# Patient Record
Sex: Female | Born: 1975 | Race: Black or African American | Hispanic: No | Smoking: Former smoker
Health system: Southern US, Community
[De-identification: ages and names within clinical notes are randomized; demographics above are authoritative.]

## PROBLEM LIST (undated history)

## (undated) DIAGNOSIS — D649 Anemia, unspecified: Secondary | ICD-10-CM

## (undated) HISTORY — PX: TUBAL LIGATION: SHX77

## (undated) HISTORY — PX: WISDOM TOOTH EXTRACTION: SHX21

---

## 1998-11-27 ENCOUNTER — Other Ambulatory Visit: Admission: RE | Admit: 1998-11-27 | Discharge: 1998-11-27 | Payer: Self-pay | Admitting: Obstetrics and Gynecology

## 1999-02-26 ENCOUNTER — Inpatient Hospital Stay (HOSPITAL_COMMUNITY): Admission: AD | Admit: 1999-02-26 | Discharge: 1999-02-26 | Payer: Self-pay | Admitting: Obstetrics & Gynecology

## 1999-04-19 ENCOUNTER — Inpatient Hospital Stay (HOSPITAL_COMMUNITY): Admission: AD | Admit: 1999-04-19 | Discharge: 1999-04-19 | Payer: Self-pay | Admitting: *Deleted

## 1999-04-20 ENCOUNTER — Inpatient Hospital Stay (HOSPITAL_COMMUNITY): Admission: AD | Admit: 1999-04-20 | Discharge: 1999-04-20 | Payer: Self-pay | Admitting: Obstetrics & Gynecology

## 1999-04-24 ENCOUNTER — Inpatient Hospital Stay (HOSPITAL_COMMUNITY): Admission: AD | Admit: 1999-04-24 | Discharge: 1999-04-26 | Payer: Self-pay | Admitting: Obstetrics & Gynecology

## 1999-04-27 ENCOUNTER — Encounter: Admission: RE | Admit: 1999-04-27 | Discharge: 1999-07-26 | Payer: Self-pay | Admitting: Obstetrics & Gynecology

## 2001-05-31 ENCOUNTER — Emergency Department (HOSPITAL_COMMUNITY): Admission: EM | Admit: 2001-05-31 | Discharge: 2001-05-31 | Payer: Self-pay | Admitting: Emergency Medicine

## 2001-07-25 ENCOUNTER — Emergency Department (HOSPITAL_COMMUNITY): Admission: EM | Admit: 2001-07-25 | Discharge: 2001-07-25 | Payer: Self-pay | Admitting: Emergency Medicine

## 2001-07-25 ENCOUNTER — Encounter: Payer: Self-pay | Admitting: Emergency Medicine

## 2003-03-01 ENCOUNTER — Emergency Department (HOSPITAL_COMMUNITY): Admission: EM | Admit: 2003-03-01 | Discharge: 2003-03-01 | Payer: Self-pay | Admitting: Emergency Medicine

## 2006-05-16 ENCOUNTER — Emergency Department (HOSPITAL_COMMUNITY): Admission: EM | Admit: 2006-05-16 | Discharge: 2006-05-16 | Payer: Self-pay | Admitting: *Deleted

## 2007-10-12 ENCOUNTER — Emergency Department (HOSPITAL_COMMUNITY): Admission: EM | Admit: 2007-10-12 | Discharge: 2007-10-13 | Payer: Self-pay | Admitting: Emergency Medicine

## 2008-04-19 ENCOUNTER — Other Ambulatory Visit: Admission: RE | Admit: 2008-04-19 | Discharge: 2008-04-19 | Payer: Self-pay | Admitting: Family Medicine

## 2010-06-01 NOTE — H&P (Signed)
[facility]  Patient:    Donna Mosley, Donna Mosley                   MRN: [id number] Adm. Date:  [id number] Disc. Date: [id number] Attending:  Cleatrice Burke Dictator:   Wynelle Bourgeois, C.N.M.                         History and Physical  HISTORY OF PRESENT ILLNESS:       Ms. Croson is a 35 year old, G3, P0-1-1-1, at 36-4/7 weeks, who presented to the office today with leaking of clear fluid since 7 oclock a.m.  She reports occasional mild uterine contractions and positive fetal movement.  She denies any bleeding at this time.  Sterile speculum exam in the office reveals positive pooling, positive nitrazine, and positive ferning and an observable trickle of clear fluid out of the vagina.  Cervical inspection on sterile speculum exam reveals 1 to cm cervix, soft, and unknown effacement. Previous exam on April 17, 1999, was 2 cm, 50% effaced, and -2 station vertex by  Dr. Stefano Gaul.  Her pregnancy has been remarkable for Rh negative, history of PROM and premature labor, irregular cycles, history of questionable IUGR with previous pregnancy, and anemia of this pregnancy.  PRENATAL LABORATORY DATA:         Hemoglobin 11.2, hematocrit 36.2.  Hemoglobin on March 30, 1999, was 8.8.  Platelets 310.  Blood type O negative.  Antibody screen negative.  Sickle cell trait negative.  RPR nonreactive.  Rubella immune. HBsAg negative.  HIV declined.  Pap test within normal limits with yeast.  Gonorrhea negative, chlamydia negative.  AFP free beta within normal limits.  Glucose challenge within normal limits.  OBSTETRICAL HISTORY:              Remarkable for an induced abortion at 14 weeks in 1995, no complications, and a spontaneous vaginal delivery in October 1998 of a  female infant 5 pounds, 10 ounces at [redacted] weeks gestation following premature rupture of membranes, and this labor was induced.  PAST MEDICAL HISTORY:             Significant for usual  childhood diseases including varicella, anemia with the last pregnancy.  FAMILY HISTORY:                   Remarkable for hypertension in maternal aunt,  maternal grandmother, and maternal grandfather.  Diabetes in maternal grandfather. Heart attack in maternal grandfather.  Kidney cancer in maternal grandfather. one cancer in paternal grandfather.  GENETIC HISTORY:                   Unremarkable.  SOCIAL HISTORY:                   The patient is married to [place] Bod[place]. She is of the [employer], and she has one female child named Diamond.  She denies ny alcohol, tobacco, or illicit drug use and denies domestic violence.  PHYSICAL EXAMINATION:  VITAL SIGNS:                      Afebrile.  Vital signs stable.  HEENT:                            Within normal limits.  LUNGS:  Clear to auscultation bilaterally.  HEART:                            Regular rate and rhythm.  BREASTS:                          Soft, nontender, no masses.  ABDOMEN:                          Gravid at 36 cm with positive fetal heart tones in the 140s to 150s.  PELVIC:                           Cervical exam deferred.  EXTREMITIES:                      Within normal limits.  ASSESSMENT:                       1. Intrauterine pregnancy at 36-4/7 weeks.                                   2. Premature rupture of membranes, clear fluid.                                   3. Group B Streptococcus negative.                                   4. Anemia.                                   5. Early labor.  PLAN:                             1. Admit to birthing suite with Dr. Cleatrice Burke as attending physician.  2. Routine physician orders.                                   3. Limit vaginal exams.                                   4. Further plans per Dr. Kathryne Sharper. DD:  04/24/99 TD:  04/24/99 Job: [id number]

## 2010-10-15 LAB — URINALYSIS, ROUTINE W REFLEX MICROSCOPIC
Bilirubin Urine: NEGATIVE
Glucose, UA: NEGATIVE
Hgb urine dipstick: NEGATIVE
Nitrite: NEGATIVE
Protein, ur: NEGATIVE
Specific Gravity, Urine: 1.016
Urobilinogen, UA: 1
pH: 6

## 2010-10-15 LAB — DIFFERENTIAL
Basophils Absolute: 0.2 — ABNORMAL HIGH
Basophils Relative: 3 — ABNORMAL HIGH
Eosinophils Absolute: 0
Eosinophils Relative: 1
Lymphocytes Relative: 21
Lymphs Abs: 1
Monocytes Absolute: 0.7
Monocytes Relative: 13 — ABNORMAL HIGH
Neutro Abs: 3
Neutrophils Relative %: 62

## 2010-10-15 LAB — CBC
HCT: 28.1 — ABNORMAL LOW
Hemoglobin: 8.9 — ABNORMAL LOW
MCHC: 31.5
MCV: 63.9 — ABNORMAL LOW
Platelets: 300
RBC: 4.4
RDW: 24.9 — ABNORMAL HIGH
WBC: 4.9

## 2010-10-15 LAB — POCT I-STAT, CHEM 8
BUN: 7
Chloride: 104
Creatinine, Ser: 0.9
Potassium: 3.8
Sodium: 138
TCO2: 25

## 2010-10-15 LAB — POCT PREGNANCY, URINE: Preg Test, Ur: NEGATIVE

## 2010-10-15 LAB — URINE MICROSCOPIC-ADD ON

## 2011-03-27 ENCOUNTER — Encounter (HOSPITAL_BASED_OUTPATIENT_CLINIC_OR_DEPARTMENT_OTHER): Payer: Self-pay | Admitting: Emergency Medicine

## 2011-03-27 ENCOUNTER — Emergency Department (HOSPITAL_BASED_OUTPATIENT_CLINIC_OR_DEPARTMENT_OTHER)
Admission: EM | Admit: 2011-03-27 | Discharge: 2011-03-27 | Disposition: A | Discharge status: Home / Self Care | Payer: BC Managed Care – PPO | Attending: Emergency Medicine | Admitting: Emergency Medicine

## 2011-03-27 ENCOUNTER — Emergency Department (INDEPENDENT_AMBULATORY_CARE_PROVIDER_SITE_OTHER): Payer: BC Managed Care – PPO

## 2011-03-27 DIAGNOSIS — R059 Cough, unspecified: Secondary | ICD-10-CM | POA: Insufficient documentation

## 2011-03-27 DIAGNOSIS — R070 Pain in throat: Secondary | ICD-10-CM | POA: Insufficient documentation

## 2011-03-27 DIAGNOSIS — R05 Cough: Secondary | ICD-10-CM

## 2011-03-27 DIAGNOSIS — IMO0001 Reserved for inherently not codable concepts without codable children: Secondary | ICD-10-CM | POA: Insufficient documentation

## 2011-03-27 DIAGNOSIS — R1310 Dysphagia, unspecified: Secondary | ICD-10-CM | POA: Insufficient documentation

## 2011-03-27 DIAGNOSIS — R0602 Shortness of breath: Secondary | ICD-10-CM | POA: Insufficient documentation

## 2011-03-27 DIAGNOSIS — R0989 Other specified symptoms and signs involving the circulatory and respiratory systems: Secondary | ICD-10-CM

## 2011-03-27 DIAGNOSIS — J40 Bronchitis, not specified as acute or chronic: Secondary | ICD-10-CM | POA: Insufficient documentation

## 2011-03-27 DIAGNOSIS — R079 Chest pain, unspecified: Secondary | ICD-10-CM | POA: Insufficient documentation

## 2011-03-27 DIAGNOSIS — R509 Fever, unspecified: Secondary | ICD-10-CM | POA: Insufficient documentation

## 2011-03-27 MED ORDER — PREDNISONE 20 MG PO TABS: 40.0000 mg | ORAL_TABLET | Freq: Every day | ORAL | Status: AC

## 2011-03-27 MED ORDER — ALBUTEROL SULFATE HFA 108 (90 BASE) MCG/ACT IN AERS
2.0000 | INHALATION_SPRAY | Freq: Once | RESPIRATORY_TRACT | Status: AC
  Administered 2011-03-27: 2 via RESPIRATORY_TRACT
  Filled 2011-03-27: qty 6.7

## 2011-03-27 MED ORDER — NAPROXEN 500 MG PO TABS: 500.0000 mg | ORAL_TABLET | Freq: Two times a day (BID) | ORAL | Status: AC

## 2011-03-27 MED ORDER — ALBUTEROL SULFATE HFA 108 (90 BASE) MCG/ACT IN AERS: 2.0000 | INHALATION_SPRAY | RESPIRATORY_TRACT | Status: DC | PRN

## 2011-03-27 MED ORDER — KETOROLAC TROMETHAMINE 60 MG/2ML IM SOLN
60.0000 mg | Freq: Once | INTRAMUSCULAR | Status: AC
  Administered 2011-03-27: 60 mg via INTRAMUSCULAR
  Filled 2011-03-27: qty 2

## 2011-03-27 MED ORDER — BENZONATATE 100 MG PO CAPS: 200.0000 mg | ORAL_CAPSULE | Freq: Two times a day (BID) | ORAL | Status: AC | PRN

## 2011-03-27 NOTE — ED Provider Notes (Signed)
History     CSN: [id number]  Arrival date & time 03/27/11  4098   None     Chief Complaint  Patient presents with  . Fever  . Sore Throat  . Cough    (Consider location/radiation/quality/duration/timing/severity/associated sxs/prior treatment) HPI Comments: 36 social female with history of 4 days of gradual onset of sore throat and cough with associated fever and myalgias. Symptoms are persistent, nothing makes better or worse, associated with pain with swallowing, no swelling rashes blurred vision diarrhea abdominal pain but she does have chest pain when she coughs.  The cough is nonproductive  Patient is a 36 y.o. female presenting with fever, pharyngitis, and cough. The history is provided by the patient.  Fever Primary symptoms of the febrile illness include fever, cough, shortness of breath and myalgias. Primary symptoms do not include fatigue, headaches, wheezing, abdominal pain, nausea, vomiting, diarrhea, dysuria, arthralgias or rash. Episode onset: 3 days ago. This is a new problem. The problem has not changed since onset. Sore Throat Associated symptoms include shortness of breath. Pertinent negatives include no abdominal pain and no headaches.  Cough Associated symptoms include myalgias and shortness of breath. Pertinent negatives include no headaches and no wheezing.    History reviewed. No pertinent past medical history.  Past Surgical History  Procedure Date  . Tubal ligation     No family history on file.  History  Substance Use Topics  . Smoking status: Never Smoker   . Smokeless tobacco: Not on file  . Alcohol Use: No    OB History    Grav Para Term Preterm Abortions TAB SAB Ect Mult Living                  Review of Systems  Constitutional: Positive for fever. Negative for fatigue.  Respiratory: Positive for cough and shortness of breath. Negative for wheezing.   Gastrointestinal: Negative for nausea, vomiting, abdominal pain and diarrhea.    Genitourinary: Negative for dysuria.  Musculoskeletal: Positive for myalgias. Negative for arthralgias.  Skin: Negative for rash.  Neurological: Negative for headaches.  All other systems reviewed and are negative.    Allergies  Review of patient's allergies indicates not on file.  Home Medications   Current Outpatient Rx  Name Route Sig Dispense Refill  . CITALOPRAM HYDROBROMIDE 10 MG PO TABS Oral Take 10 mg by mouth daily.    Marland Kitchen VITAMIN D (ERGOCALCIFEROL) 50000 UNITS PO CAPS Oral Take 50,000 Units by mouth.    . ALBUTEROL SULFATE HFA 108 (90 BASE) MCG/ACT IN AERS Inhalation Inhale 2 puffs into the lungs every 4 (four) hours as needed for wheezing or shortness of breath. 1 Inhaler 3  . BENZONATATE 100 MG PO CAPS Oral Take 2 capsules (200 mg total) by mouth 2 (two) times daily as needed for cough. 20 capsule 0  . NAPROXEN 500 MG PO TABS Oral Take 1 tablet (500 mg total) by mouth 2 (two) times daily with a meal. 30 tablet 0  . PREDNISONE 20 MG PO TABS Oral Take 2 tablets (40 mg total) by mouth daily. 10 tablet 0    BP 116/72  Pulse 91  Temp(Src) 98.7 F (37.1 C) (Oral)  Resp 18  SpO2 99%  LMP 03/20/2011  Physical Exam  Nursing note and vitals reviewed. Constitutional: She appears well-developed and well-nourished. No distress.  HENT:  Head: Normocephalic and atraumatic.  Mouth/Throat: No oropharyngeal exudate.       Pharyngeal erythema, no exudate asymmetry or hypertrophy of the  tonsils, mucous membranes moist  Eyes: Conjunctivae and EOM are normal. Pupils are equal, round, and reactive to light. Right eye exhibits no discharge. Left eye exhibits no discharge. No scleral icterus.  Neck: Normal range of motion. Neck supple. No JVD present. No thyromegaly present.  Cardiovascular: Normal rate, regular rhythm, normal heart sounds and intact distal pulses.  Exam reveals no gallop and no friction rub.   No murmur heard. Pulmonary/Chest: Effort normal and breath sounds normal. No  respiratory distress. She has no wheezes. She has no rales.  Abdominal: Soft. Bowel sounds are normal. She exhibits no distension and no mass. There is no tenderness.  Musculoskeletal: Normal range of motion. She exhibits no edema and no tenderness.  Lymphadenopathy:    She has no cervical adenopathy.  Neurological: She is alert. Coordination normal.  Skin: Skin is warm and dry. No rash noted. No erythema.  Psychiatric: She has a normal mood and affect. Her behavior is normal.    ED Course  Procedures (including critical care time)  Labs Reviewed - No data to display Dg Chest 2 View  03/27/2011  *RADIOLOGY REPORT*  Clinical Data: Cough and congestion.  Rule out pneumonia.  CHEST - 2 VIEW  Comparison: Chest x-ray 10/12/2007.  Findings: Lung volumes are normal.  No consolidative airspace disease.  No pleural effusions.  No pneumothorax.  No pulmonary nodule or mass noted.  Pulmonary vasculature and the cardiomediastinal silhouette are within normal limits.  IMPRESSION: 1. No radiographic evidence of acute cardiopulmonary disease.  Original Report Authenticated By: Florencia Reasons, M.D.     1. Bronchitis       MDM  Abdomen soft, heart rate normal, lungs clear without wheezing or rales. Oxygen level of 99% on room air with temperature of 98.7. She has obvious pharyngeal erythema and likely upper respiratory infection but do to chest pain with coughing we'll rule out consolidation on radiography.  Chest x-ray negative for acute infiltrate, will discharge home        Vida Roller, MD 03/28/11 [phone]

## 2011-03-27 NOTE — ED Notes (Signed)
Pt c/o fever, chills, sore throat, cough, and body aches.

## 2011-03-27 NOTE — Discharge Instructions (Signed)
Please take the medications exactly as prescribed including the albuterol inhaler 2 puffs every 4 hours for the first 24 hours then 2 puffs every 4 hours as needed for difficulty breathing, coughing, wheezing. Take prednisone once a day for 5 days, Tessalon for cough , Naprosyn for chest pain.  RESOURCE GUIDE  Dental Problems  Patients with Medicaid: [facility] [phone] [address]                                           [phone] [address] Phone:  [phone]                                                  Phone:  [phone]  If unable to pay or uninsured, contact:  [facility] or [facility]. to become qualified for the adult dental clinic.  Chronic Pain Problems Contact [facility]  [phone] Patients need to be referred by their primary care doctor.  Insufficient Money for Medicine Contact United Way:  call "211" or [facility] [phone].  No Primary Care Doctor Call [facility]  [phone] Other agencies that provide inexpensive medical care    [facility]  [phone]    [facility]  [phone]    [facility]  [phone]    [facility]  [phone]    [facility]  [phone]    [facility]  [phone]  Psychological Services [facility]  [phone] [facility] Services  [phone] [facility] Mental [facility]   [phone] (emergency services [phone])  Substance Abuse Resources [facility]  [phone] [facility] [phone] [facility] [phone] Floydene Flock [phone] Residential & Outpatient Substance Abuse Program  [phone]  Abuse/Neglect [facility] Child Abuse Hotline [phone] [facility] Child Abuse Hotline 269-714-7310 (After Hours)  Emergency Shelter [facility] [phone]  Maternity Homes [facility] [place] [facility]  [phone] [facility] [phone]  MRSA Hotline #:   607-658-1698    [facility] Resources  [facility] [place]     [employer]                          [facility] [address] [place]                       [address]      [address]  [place]                                                Cristobal Goldmann Phone:  [phone]  Phone:  [phone]                 Phone:  [phone]  [place] Phone:  [phone]  [place] Child Abuse Hotline [phone]) 342-1394 [phone] (After Hours)   

## 2011-03-27 NOTE — ED Notes (Signed)
Epic is having a problem and unable to scan medications.

## 2013-05-18 ENCOUNTER — Telehealth: Payer: Self-pay | Admitting: Hematology & Oncology

## 2013-05-18 NOTE — Telephone Encounter (Signed)
I spoke w NEW PATIENT today to remind them of their appointment with Dr. Ennever. Also, advised them to bring all meds and insurance information. ° °

## 2013-05-19 ENCOUNTER — Ambulatory Visit: Payer: BC Managed Care – PPO

## 2013-05-19 ENCOUNTER — Telehealth: Payer: Self-pay | Admitting: Hematology & Oncology

## 2013-05-19 ENCOUNTER — Ambulatory Visit: Payer: BC Managed Care – PPO | Admitting: Hematology & Oncology

## 2013-05-19 ENCOUNTER — Other Ambulatory Visit: Payer: BC Managed Care – PPO | Admitting: Lab

## 2013-05-19 NOTE — Telephone Encounter (Signed)
Pt moved 5-6 to 6-16

## 2013-05-29 ENCOUNTER — Encounter (HOSPITAL_BASED_OUTPATIENT_CLINIC_OR_DEPARTMENT_OTHER): Payer: Self-pay | Admitting: Emergency Medicine

## 2013-05-29 ENCOUNTER — Emergency Department (HOSPITAL_BASED_OUTPATIENT_CLINIC_OR_DEPARTMENT_OTHER): Payer: BC Managed Care – PPO

## 2013-05-29 ENCOUNTER — Emergency Department (HOSPITAL_BASED_OUTPATIENT_CLINIC_OR_DEPARTMENT_OTHER)
Admission: EM | Admit: 2013-05-29 | Discharge: 2013-05-30 | Disposition: A | Discharge status: Home / Self Care | Payer: BC Managed Care – PPO | Attending: Emergency Medicine | Admitting: Emergency Medicine

## 2013-05-29 DIAGNOSIS — Z79899 Other long term (current) drug therapy: Secondary | ICD-10-CM | POA: Insufficient documentation

## 2013-05-29 DIAGNOSIS — J9801 Acute bronchospasm: Secondary | ICD-10-CM | POA: Insufficient documentation

## 2013-05-29 DIAGNOSIS — R55 Syncope and collapse: Secondary | ICD-10-CM | POA: Insufficient documentation

## 2013-05-29 DIAGNOSIS — Z862 Personal history of diseases of the blood and blood-forming organs and certain disorders involving the immune mechanism: Secondary | ICD-10-CM | POA: Insufficient documentation

## 2013-05-29 DIAGNOSIS — IMO0002 Reserved for concepts with insufficient information to code with codable children: Secondary | ICD-10-CM | POA: Insufficient documentation

## 2013-05-29 DIAGNOSIS — F17200 Nicotine dependence, unspecified, uncomplicated: Secondary | ICD-10-CM | POA: Insufficient documentation

## 2013-05-29 HISTORY — DX: Anemia, unspecified: D64.9

## 2013-05-29 MED ORDER — ALBUTEROL SULFATE HFA 108 (90 BASE) MCG/ACT IN AERS: 2.0000 | INHALATION_SPRAY | RESPIRATORY_TRACT | Status: DC | PRN

## 2013-05-29 MED ORDER — PREDNISONE 20 MG PO TABS: ORAL_TABLET | ORAL | Status: DC

## 2013-05-29 NOTE — ED Provider Notes (Signed)
CSN: [id number]     Arrival date & time 05/29/13  2217 History   First MD Initiated Contact with Patient 05/29/13 2339     Chief Complaint  Patient presents with  . Cough     (Consider location/radiation/quality/duration/timing/severity/associated sxs/prior Treatment) HPI 38 year old female with a one-week history of nasal congestion with nonproductive moderately severe intermittent cough with no fever no confusion no shortness of breath no abdominal pain but she does have occasional posttussive emesis no diarrhea no rash no treatment prior to arrival other than no relief with over-the-counter cold and cough medicines; she's been coughing so much it starts to cause a headache at times with some lightheadedness but no severe headache no confusion no stiff neck. Past Medical History  Diagnosis Date  . Anemia    Past Surgical History  Procedure Laterality Date  . Tubal ligation     History reviewed. No pertinent family history. History  Substance Use Topics  . Smoking status: Light Tobacco Smoker  . Smokeless tobacco: Not on file  . Alcohol Use: No   OB History   Grav Para Term Preterm Abortions TAB SAB Ect Mult Living                 Review of Systems 10 Systems reviewed and are negative for acute change except as noted in the HPI.   Allergies  Review of patient's allergies indicates no known allergies.  Home Medications   Prior to Admission medications   Medication Sig Start Date End Date Taking? Authorizing Provider  albuterol (PROVENTIL HFA;VENTOLIN HFA) 108 (90 BASE) MCG/ACT inhaler Inhale 2 puffs into the lungs every 4 (four) hours as needed for wheezing or shortness of breath. 03/27/11 03/26/12  Vida RollerBrian D Miller, MD  albuterol (PROVENTIL HFA;VENTOLIN HFA) 108 (90 BASE) MCG/ACT inhaler Inhale 2 puffs into the lungs every 2 (two) hours as needed for wheezing or shortness of breath (cough). 05/29/13   Hurman HornJohn M Muadh Creasy, MD  citalopram (CELEXA) 10 MG tablet Take 10 mg by mouth  daily.    Historical Provider, MD  predniSONE (DELTASONE) 20 MG tablet 3 tabs po day one, then 2 po daily x 4 days 05/29/13   Hurman HornJohn M Aashi Derrington, MD  Vitamin D, Ergocalciferol, (DRISDOL) 50000 UNITS CAPS Take 50,000 Units by mouth.    Historical Provider, MD   BP 124/68  Pulse 73  Temp(Src) 98.8 F (37.1 C) (Oral)  Resp 21  SpO2 99%  LMP 05/09/2013 Physical Exam  Nursing note and vitals reviewed. Constitutional:  Awake, alert, nontoxic appearance.  HENT:  Head: Atraumatic.  Mouth/Throat: Oropharynx is clear and moist. No oropharyngeal exudate.  Eyes: Right eye exhibits no discharge. Left eye exhibits no discharge.  Neck: Neck supple.  Cardiovascular: Normal rate and regular rhythm.   No murmur heard. Pulmonary/Chest: Effort normal. No respiratory distress. She has wheezes. She has no rales. She exhibits tenderness.  Respirations unlabored, scattered faint end expiratory wheezes, normal speech, no retractions, no accessory muscle usage, pulse oximetry normal on room air at 98%  Abdominal: Soft. Bowel sounds are normal. She exhibits no distension. There is no tenderness. There is no rebound and no guarding.  Musculoskeletal: She exhibits no tenderness.  Baseline ROM, no obvious new focal weakness.  Neurological: She is alert.  Mental status and motor strength appears baseline for patient and situation.  Skin: No rash noted.  Psychiatric: She has a normal mood and affect.    ED Course  Procedures (including critical care time) Patient informed of clinical course,  understand medical decision-making process, and agree with plan. Labs Review Labs Reviewed - No data to display  Imaging Review Dg Chest 2 View  05/30/2013   CLINICAL DATA:  Cough and dizziness.  EXAM: CHEST  2 VIEW  COMPARISON:  DG CHEST 2 VIEW dated 03/27/2011  FINDINGS: Cardiomediastinal silhouette is unremarkable. The lungs are clear without pleural effusions or focal consolidations. Trachea projects midline and there is  no pneumothorax. Soft tissue planes and included osseous structures are non-suspicious.  IMPRESSION: No acute cardiopulmonary process.   Electronically Signed   By: Awilda Metroourtnay  Bloomer   On: 05/30/2013 00:01     EKG Interpretation None      MDM   Final diagnoses:  Bronchospasm    I doubt any other EMC precluding discharge at this time including, but not necessarily limited to the following:SBI.    Hurman HornJohn M Avyon Herendeen, MD 05/31/13 [phone]

## 2013-05-29 NOTE — ED Notes (Signed)
Pt report unrelenting cough that causes headache and dizziness lungs clear

## 2013-05-29 NOTE — Discharge Instructions (Signed)
Bronchospasm, Adult A bronchospasm is when the tubes that carry air in and out of your lungs (airwarys) spasm or tighten. During a bronchospasm it is hard to breathe. This is because the airways get smaller. A bronchospasm can be triggered by:  Allergies. These may be to animals, pollen, food, or mold.  Infection. This is a common cause of bronchospasm.  Exercise.  Irritants. These include pollution, cigarette smoke, strong odors, aerosol sprays, and paint fumes.  Weather changes.  Stress.  Being emotional. HOME CARE   Always have a plan for getting help. Know when to call your doctor and local emergency services (911 in the [place] Know where you can get emergency care.  Only take medicines as told by your doctor.  If you were prescribed an inhaler or nebulizer machine, ask your doctor how to use it correctly. Always use a spacer with your inhaler if you were given one.  Stay calm during an attack. Try to relax and breathe more slowly.  Control your home environment:  Change your heating and air conditioning filter at least once a month.  Limit your use of fireplaces and wood stoves.  Do not  smoke. Do not  allow smoking in your home.  Avoid perfumes and fragrances.  Get rid of pests (such as roaches and mice) and their droppings.  Throw away plants if you see mold on them.  Keep your house clean and dust free.  Replace carpet with wood, tile, or vinyl flooring. Carpet can trap dander and dust.  Use allergy-proof pillows, mattress covers, and box spring covers.  Wash bed sheets and blankets every week in hot water. Dry them in a dryer.  Use blankets that are made of polyester or cotton.  Wash hands frequently. GET HELP IF:  You have muscle aches.  You have chest pain.  The thick spit you spit or cough up (sputum) changes from clear or white to yellow, green, gray, or bloody.  The thick spit you spit or cough up gets thicker.  There are problems that may be  related to the medicine you are given such as:  A rash.  Itching.  Swelling.  Trouble breathing. GET HELP RIGHT AWAY IF:  You feel you cannot breathe or catch your breath.  You cannot stop coughing.  Your treatment is not helping you breathe better. MAKE SURE YOU:   Understand these instructions.  Will watch your condition.  Will get help right away if you are not doing well or get worse. Document Released: 10/28/2008 Document Revised: 09/02/2012 Document Reviewed: 06/23/2012 [facility] Patient Information 2014 [place].  How to Use an Inhaler Using your inhaler correctly is very important. Good technique will make sure that the medicine reaches your lungs.  HOW TO USE AN INHALER: 1. Take the cap off the inhaler. 2. If this is the first time using your inhaler, you need to prime it. Shake the inhaler for 5 seconds. Release four puffs into the air, away from your face. Ask your doctor for help if you have questions. 3. Shake the inhaler for 5 seconds. 4. Turn the inhaler so the bottle is above the mouthpiece. 5. Put your pointer finger on top of the bottle. Your thumb holds the bottom of the inhaler. 6. Open your mouth. 7. Either hold the inhaler away from your mouth (the width of 2 fingers) or place your lips tightly around the mouthpiece. Ask your doctor which way to use your inhaler. 8. Breathe out as much air as  possible. 9. Breathe in and push down on the bottle 1 time to release the medicine. You will feel the medicine go in your mouth and throat. 10. Continue to take a deep breath in very slowly. Try to fill your lungs. 11. After you have breathed in completely, hold your breath for 10 seconds. This will help the medicine to settle in your lungs. If you cannot hold your breath for 10 seconds, hold it for as long as you can before you breathe out. 12. Breathe out slowly, through pursed lips. Whistling is an example of pursed lips. 13. If your doctor has told you to  take more than 1 puff, wait at least 15 30 seconds between puffs. This will help you get the best results from your medicine. Do not use the inhaler more than your doctor tells you to. 14. Put the cap back on the inhaler. 15. Follow the directions from your doctor or from the inhaler package about cleaning the inhaler. If you use more than one inhaler, ask your doctor which inhalers to use and what order to use them in. Ask your doctor to help you figure out when you will need to refill your inhaler.  If you use a steroid inhaler, always rinse your mouth with water after your last puff, gargle and spit out the water. Do not swallow the water. GET HELP IF:  The inhaler medicine only partially helps to stop wheezing or shortness of breath.  You are having trouble using your inhaler.  You have some increase in thick spit (phlegm). GET HELP RIGHT AWAY IF:  The inhaler medicine does not help your wheezing or shortness of breath or you have tightness in your chest.  You have dizziness, headaches, or fast heart rate.  You have chills, fever, or night sweats.  You have a large increase of thick spit, or your thick spit is bloody. MAKE SURE YOU:   Understand these instructions.  Will watch your condition.  Will get help right away if you are not doing well or get worse. Document Released: 10/10/2007 Document Revised: 10/21/2012 Document Reviewed: 07/30/2012 [facility] Patient Information 2014 [place], [place]. You appear to have an upper respiratory infection (URI). An upper respiratory tract infection, or cold, is a viral infection of the air passages leading to the lungs. It is contagious and can be spread to others, especially during the first 3 or 4 days. It cannot be cured by antibiotics or other medicines. RETURN IMMEDIATELY IF you develop shortness of breath, confusion or altered mental status, a new rash, become dizzy, faint, or poorly responsive, or are unable to be cared for at home.

## 2013-06-25 ENCOUNTER — Telehealth: Payer: Self-pay | Admitting: Hematology & Oncology

## 2013-06-25 NOTE — Telephone Encounter (Signed)
Left vm w NEW PATIENT today to remind them of their appointment with Dr. Ennever. Also, advised them to bring all medication bottles and insurance card information. ° °

## 2013-06-28 ENCOUNTER — Telehealth: Payer: Self-pay | Admitting: Hematology & Oncology

## 2013-06-28 NOTE — Telephone Encounter (Signed)
Pt moved 6-16 to 6-24

## 2013-06-29 ENCOUNTER — Other Ambulatory Visit: Payer: BC Managed Care – PPO | Admitting: Lab

## 2013-06-29 ENCOUNTER — Ambulatory Visit: Payer: BC Managed Care – PPO | Admitting: Hematology & Oncology

## 2013-06-29 ENCOUNTER — Ambulatory Visit: Payer: BC Managed Care – PPO

## 2013-07-07 ENCOUNTER — Ambulatory Visit: Payer: BC Managed Care – PPO

## 2013-07-07 ENCOUNTER — Other Ambulatory Visit: Payer: BC Managed Care – PPO | Admitting: Lab

## 2013-07-07 ENCOUNTER — Encounter: Payer: BC Managed Care – PPO | Admitting: Hematology & Oncology

## 2013-07-19 NOTE — Progress Notes (Signed)
This encounter was created in error - please disregard.

## 2015-12-01 ENCOUNTER — Other Ambulatory Visit (HOSPITAL_COMMUNITY)
Admission: RE | Admit: 2015-12-01 | Discharge: 2015-12-01 | Disposition: A | Discharge status: Home / Self Care | Payer: Commercial Managed Care - HMO | Source: Ambulatory Visit | Attending: Family Medicine | Admitting: Family Medicine

## 2015-12-01 ENCOUNTER — Ambulatory Visit (INDEPENDENT_AMBULATORY_CARE_PROVIDER_SITE_OTHER): Payer: Commercial Managed Care - HMO | Admitting: Family Medicine

## 2015-12-01 ENCOUNTER — Encounter: Payer: Self-pay | Admitting: Family Medicine

## 2015-12-01 VITALS — BP 114/86 | HR 67 | Temp 98.0°F | Ht 64.0 in | Wt 177.4 lb

## 2015-12-01 DIAGNOSIS — Z0000 Encounter for general adult medical examination without abnormal findings: Secondary | ICD-10-CM | POA: Diagnosis not present

## 2015-12-01 DIAGNOSIS — Z13220 Encounter for screening for lipoid disorders: Secondary | ICD-10-CM | POA: Diagnosis not present

## 2015-12-01 DIAGNOSIS — Z113 Encounter for screening for infections with a predominantly sexual mode of transmission: Secondary | ICD-10-CM | POA: Diagnosis present

## 2015-12-01 DIAGNOSIS — N393 Stress incontinence (female) (male): Secondary | ICD-10-CM

## 2015-12-01 DIAGNOSIS — N926 Irregular menstruation, unspecified: Secondary | ICD-10-CM

## 2015-12-01 DIAGNOSIS — E6609 Other obesity due to excess calories: Secondary | ICD-10-CM

## 2015-12-01 DIAGNOSIS — Z114 Encounter for screening for human immunodeficiency virus [HIV]: Secondary | ICD-10-CM

## 2015-12-01 DIAGNOSIS — Z23 Encounter for immunization: Secondary | ICD-10-CM

## 2015-12-01 DIAGNOSIS — Z6830 Body mass index (BMI) 30.0-30.9, adult: Secondary | ICD-10-CM | POA: Diagnosis not present

## 2015-12-01 DIAGNOSIS — Z1239 Encounter for other screening for malignant neoplasm of breast: Secondary | ICD-10-CM

## 2015-12-01 DIAGNOSIS — Z131 Encounter for screening for diabetes mellitus: Secondary | ICD-10-CM | POA: Diagnosis not present

## 2015-12-01 DIAGNOSIS — Z1231 Encounter for screening mammogram for malignant neoplasm of breast: Secondary | ICD-10-CM

## 2015-12-01 DIAGNOSIS — F4323 Adjustment disorder with mixed anxiety and depressed mood: Secondary | ICD-10-CM | POA: Diagnosis not present

## 2015-12-01 LAB — COMPREHENSIVE METABOLIC PANEL
ALBUMIN: 4 g/dL (ref 3.5–5.2)
ALK PHOS: 56 U/L (ref 39–117)
ALT: 10 U/L (ref 0–35)
AST: 14 U/L (ref 0–37)
BILIRUBIN TOTAL: 0.8 mg/dL (ref 0.2–1.2)
BUN: 16 mg/dL (ref 6–23)
CALCIUM: 8.9 mg/dL (ref 8.4–10.5)
CHLORIDE: 105 meq/L (ref 96–112)
CO2: 28 mEq/L (ref 19–32)
CREATININE: 0.64 mg/dL (ref 0.40–1.20)
GFR: 131.64 mL/min (ref >60.00)
Glucose, Bld: 73 mg/dL (ref 70–99)
Potassium: 3.9 mEq/L (ref 3.5–5.1)
Sodium: 139 mEq/L (ref 135–145)
Total Protein: 7.2 g/dL (ref 6.0–8.3)

## 2015-12-01 LAB — TSH: TSH: 0.77 u[IU]/mL (ref 0.35–4.50)

## 2015-12-01 LAB — LIPID PANEL
Cholesterol: 186 mg/dL (ref 0–200)
HDL: 52.5 mg/dL (ref >39.00)
LDL Cholesterol: 126 mg/dL — ABNORMAL HIGH (ref 0–99)
NONHDL: 133.37
Total CHOL/HDL Ratio: 4
Triglycerides: 37 mg/dL (ref 0.0–149.0)
VLDL: 7.4 mg/dL (ref 0.0–40.0)

## 2015-12-01 LAB — LUTEINIZING HORMONE: LH: 9.34 m[IU]/mL

## 2015-12-01 LAB — FOLLICLE STIMULATING HORMONE: FSH: 13.3 m[IU]/mL

## 2015-12-01 LAB — POCT URINE PREGNANCY: PREG TEST UR: NEGATIVE

## 2015-12-01 LAB — HEMOGLOBIN A1C: HEMOGLOBIN A1C: 5 % (ref 4.6–6.5)

## 2015-12-01 MED ORDER — CITALOPRAM HYDROBROMIDE 10 MG PO TABS: 10.0000 mg | ORAL_TABLET | Freq: Every day | ORAL | 1 refills | Status: DC

## 2015-12-01 NOTE — Progress Notes (Signed)
Pre visit review using our clinic review tool, if applicable. No additional management support is needed unless otherwise documented below in the visit note. 

## 2015-12-01 NOTE — Progress Notes (Signed)
Chief Complaint  Patient presents with  . Establish Care    annual exam with possible pap/Pt is currently on menstraul    Well Woman Donna Mosley is here for a complete physical.   Her last physical was 1 year(s) ago.  Current diet: in general, a "healthy" diet  . Has lost 40 lbs over the past year. 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 Weight is stable and she denies daytime fatigue. Her last pap smear was normal.  >5 years ago though. Patient's last menstrual period was 11/29/2015..  Follow with a Dentist? Yes.   Follow with an Optometrist? No. Home safety concerns? No. Concerns:  Found out husband has been unfaithful, would like to be screened for STI's. No symptoms.  Due to divorce, having more anxiety and depressive symptoms. No thoughts of harming self or others. She had been on Celexa in the past, would like to try to go back on it.   She will leak small amounts of urine when she sneezes or laughs. No pain or blood in urine. She has had 3 vaginal deliveries in the past.   Currently having period, heavy. Last period was 6 mo ago and the last one before that was 4 mos before. She was like clockwork prior to this. She has taken several pregnancy tests that were all negative. Does not remember when her mother went through menopause.  During ROS, she also described a sharp pain over the medial aspect of her L foot and L upper arm. Lasts an entire day and has no triggers. Frequency of around 1x/week. Does not have currently.   Past Medical History:  Diagnosis Date  . Anemia     Past Surgical History:  Procedure Laterality Date  . TUBAL LIGATION    . TUBAL LIGATION     Medications  Current Outpatient Prescriptions on File Prior to Visit  Medication Sig Dispense Refill  . albuterol (PROVENTIL HFA;VENTOLIN HFA) 108 (90 BASE) MCG/ACT inhaler Inhale 2 puffs into the lungs every 2 (two) hours as needed for wheezing or shortness of breath (cough). 1 Inhaler 0  .  albuterol (PROVENTIL HFA;VENTOLIN HFA) 108 (90 BASE) MCG/ACT inhaler Inhale 2 puffs into the lungs every 4 (four) hours as needed for wheezing or shortness of breath. 1 Inhaler 3  . citalopram (CELEXA) 10 MG tablet Take 10 mg by mouth daily.    . predniSONE (DELTASONE) 20 MG tablet 3 tabs po day one, then 2 po daily x 4 days (Patient not taking: Reported on 12/01/2015) 11 tablet 0  . Vitamin D, Ergocalciferol, (DRISDOL) 50000 UNITS CAPS Take 50,000 Units by mouth.     Allergies Allergies  Allergen Reactions  . Sulfa Antibiotics Hives    Review of Systems: Constitutional:  no unexpected change in weight, no weakness, no unexplained fevers, sweats, or chills Eye:  no recent significant change in vision Ear/Nose/Mouth/Throat:  Ears:  no tinnitus or vertigo and no recent change in hearing, Nose/Mouth/Throat:  no complaints of nasal congestion or discharge, no sore throat and no recent change in voice or hoarseness Cardiovascular:  no exercise intolerance, no chest pain, no palpitations Respiratory:  no chronic cough, sputum, or hemoptysis and no shortness of breath Gastrointestinal:  no abdominal pain, no change in bowel habits, no significant change in appetite, no nausea, vomiting, diarrhea, or constipation and no black or bloody stool GU:  Female: negative for dysuria, frequency, +incontinence, Normal menses; no abnormal bleeding, pelvic pain, or discharge Musculoskeletal/Extremities:  No current pain, redness,  or swelling of the joints; +L foot and L arm pain as noted in HPI Integumentary (Skin/Breast):  no abnormal skin lesions reported, no new breast lumps or masses Neurologic:  no chronic headaches, no numbness, tingling, or tremor Psychiatric:  +anxiety, +depression Endocrine:  denies fatigue, weight changes, heat/cold intolerance, bowel or skin changes, or cardiovascular system symptoms Hematologic/Lymphatic:  no abnormal bleeding, no HIV risk factors, no night sweats, no swollen nodes,  no weight loss Allergic/Immunologic:  no history of food or environmental allergies  Exam BP 114/86 (BP Location: Right Arm, Patient Position: Sitting, Cuff Size: Small)   Pulse 67   Temp 98 F (36.7 C) (Oral)   Ht 5\' 4"  (1.626 m)   Wt 177 lb 6.4 oz (80.5 kg)   LMP 11/29/2015   SpO2 98%   BMI 30.45 kg/m  General:  well developed, well nourished, in no apparent distress Skin:  no significant moles, warts, or growths Head:  no masses, lesions, or tenderness Eyes:  pupils equal and round, sclera anicteric without injection Ears:  canals without lesions, TMs shiny without retraction, no obvious effusion, no erythema Nose:  nares patent, septum midline, mucosa normal, and no drainage or sinus tenderness Throat/Pharynx:  lips and gingiva without lesion; tongue and uvula midline; non-inflamed pharynx; no exudates or postnasal drainage Neck: neck supple without adenopathy, thyromegaly, or masses Breasts: Deferred Thorax:  nontender Lungs:  clear to auscultation, breath sounds equal bilaterally, no respiratory distress Cardio:  regular rate and rhythm without murmurs, heart sounds without clicks or rubs, point of maximal impulse normal; no lifts, heaves, or thrills Abdomen:  abdomen soft, nontender; bowel sounds normal; no masses or organomegaly Genital: Female: Deferred Musculoskeletal:  symmetrical muscle groups noted without atrophy or deformity Extremities:  no clubbing, cyanosis, or edema, no deformities, no skin discoloration Neuro:  gait normal; deep tendon reflexes normal and symmetric Psych: well oriented with normal range of affect and appropriate judgment/insight  Assessment and Plan  Well adult exam - Plan: Urine cytology ancillary only  Situational mixed anxiety and depressive disorder - Plan: citalopram (CELEXA) 10 MG tablet  Irregular periods - Plan: TSH, FSH, LH, Prolactin, POCT urine pregnancy  Encounter for screening for HIV - Plan: HIV antibody  Screening for  diabetes mellitus - Plan: Hemoglobin A1c  Class 1 obesity due to excess calories without serious comorbidity with body mass index (BMI) of 30.0 to 30.9 in adult - Plan: Comprehensive metabolic panel  Stress incontinence  Screening cholesterol level - Plan: Lipid panel  Screening for breast cancer - Plan: MM DIGITAL SCREENING BILATERAL  Encounter for immunization - Plan: Flu Vaccine QUAD 36+ mos IM   Well 40 y.o. female. Counseled on diet and exercise. She is doing an excellent job with this. Is going through a stressful life event- will screen for STI's, restart Celexa as she has done well with this in the past. I will see her in 6 weeks to recheck this.  Will f/u in 2 weeks to do pap and discuss her periods in more depth. I will be able to see if her FSH/LH are suggestive of a perimenopausal state by then. Kegel exercises given. Immunizations, labs, and further orders as above. Follow up in 1 year pending the above workup. The patient voiced understanding and agreement to the plan.  Jilda Rocheicholas Paul [place], DO 12/01/15 11:43 AM

## 2015-12-01 NOTE — Patient Instructions (Addendum)
Kegel Exercises  The goal of Kegel exercises is to isolate and exercise your pelvic floor muscles. These muscles act as a hammock that supports the rectum, vagina, small intestine, and uterus. As the muscles weaken, the hammock sags and these organs are displaced from their normal positions. Kegel exercises can strengthen your pelvic floor muscles and help you to improve bladder and bowel control, improve sexual response, and help reduce many problems and some discomfort during pregnancy. Kegel exercises can be done anywhere and at any time.  HOW TO PERFORM KEGEL EXERCISES  1. Locate your pelvic floor muscles. To do this, squeeze (contract) the muscles that you use when you try to stop the flow of urine. You will feel a tightness in the vaginal area (women) and a tight lift in the rectal area (men and women).  2. When you begin, contract your pelvic muscles tight for 2-5 seconds, then relax them for 2-5 seconds. This is one set. Do 4-5 sets with a short pause in between.  3. Contract your pelvic muscles for 8-10 seconds, then relax them for 8-10 seconds. Do 4-5 sets. If you cannot contract your pelvic muscles for 8-10 seconds, try 5-7 seconds and work your way up to 8-10 seconds. Your goal is 4-5 sets of 10 contractions each day.  Keep your stomach, buttocks, and legs relaxed during the exercises. Perform sets of both short and long contractions. Vary your positions. Perform these contractions 3-4 times per day. Perform sets while you are:    · Lying in bed in the morning.  · Standing at lunch.  · Sitting in the late afternoon.  · Lying in bed at night.   You should do 40-50 contractions per day. Do not perform more Kegel exercises per day than recommended. Overexercising can cause muscle fatigue. Continue these exercises for for at least 15-20 weeks or as directed by your caregiver.     This information is not intended to replace advice given to you by your  health care provider. Make sure you discuss any questions you have with your health care provider.     Document Released: 12/18/2011 Document Revised: 01/21/2014 Document Reviewed: 11/20/2014  Elsevier Interactive Patient Education ©2017 [employer]

## 2015-12-02 LAB — HIV ANTIBODY (ROUTINE TESTING W REFLEX): HIV: NONREACTIVE

## 2015-12-02 LAB — PROLACTIN: PROLACTIN: 5.9 ng/mL

## 2015-12-04 LAB — URINE CYTOLOGY ANCILLARY ONLY
CHLAMYDIA, DNA PROBE: NEGATIVE
NEISSERIA GONORRHEA: NEGATIVE
Trichomonas: NEGATIVE

## 2015-12-11 ENCOUNTER — Ambulatory Visit (HOSPITAL_BASED_OUTPATIENT_CLINIC_OR_DEPARTMENT_OTHER): Payer: Commercial Managed Care - HMO

## 2015-12-14 ENCOUNTER — Ambulatory Visit (HOSPITAL_BASED_OUTPATIENT_CLINIC_OR_DEPARTMENT_OTHER)
Admission: RE | Admit: 2015-12-14 | Discharge: 2015-12-14 | Disposition: A | Discharge status: Home / Self Care | Payer: Commercial Managed Care - HMO | Source: Ambulatory Visit | Attending: Family Medicine | Admitting: Family Medicine

## 2015-12-14 DIAGNOSIS — R921 Mammographic calcification found on diagnostic imaging of breast: Secondary | ICD-10-CM | POA: Insufficient documentation

## 2015-12-14 DIAGNOSIS — Z1239 Encounter for other screening for malignant neoplasm of breast: Secondary | ICD-10-CM

## 2015-12-14 DIAGNOSIS — Z1231 Encounter for screening mammogram for malignant neoplasm of breast: Secondary | ICD-10-CM | POA: Diagnosis not present

## 2015-12-15 ENCOUNTER — Ambulatory Visit: Payer: Commercial Managed Care - HMO | Admitting: Family Medicine

## 2015-12-19 ENCOUNTER — Other Ambulatory Visit: Payer: Self-pay | Admitting: Family Medicine

## 2015-12-19 DIAGNOSIS — R928 Other abnormal and inconclusive findings on diagnostic imaging of breast: Secondary | ICD-10-CM

## 2015-12-20 ENCOUNTER — Other Ambulatory Visit: Payer: Self-pay | Admitting: Family Medicine

## 2015-12-20 ENCOUNTER — Other Ambulatory Visit: Payer: Self-pay

## 2015-12-20 ENCOUNTER — Telehealth: Payer: Self-pay | Admitting: Family Medicine

## 2015-12-20 DIAGNOSIS — R928 Other abnormal and inconclusive findings on diagnostic imaging of breast: Secondary | ICD-10-CM

## 2015-12-20 NOTE — Telephone Encounter (Signed)
[facility] [place] [facility] stating that patient is coming in tomorrow at 1:00 for her mammogram. They need her orders. Please advise.    Phone: [phone] Fax: [phone]

## 2015-12-21 ENCOUNTER — Ambulatory Visit
Admission: RE | Admit: 2015-12-21 | Discharge: 2015-12-21 | Disposition: A | Discharge status: Home / Self Care | Payer: Commercial Managed Care - HMO | Source: Ambulatory Visit | Attending: Family Medicine | Admitting: Family Medicine

## 2015-12-21 ENCOUNTER — Other Ambulatory Visit: Payer: Self-pay | Admitting: Family Medicine

## 2015-12-21 DIAGNOSIS — R928 Other abnormal and inconclusive findings on diagnostic imaging of breast: Secondary | ICD-10-CM

## 2015-12-21 NOTE — Telephone Encounter (Signed)
Faxed order for Mammogram to [facility] per their request.

## 2016-01-12 ENCOUNTER — Ambulatory Visit: Payer: Commercial Managed Care - HMO | Admitting: Family Medicine

## 2016-02-03 ENCOUNTER — Emergency Department (HOSPITAL_BASED_OUTPATIENT_CLINIC_OR_DEPARTMENT_OTHER)
Admission: EM | Admit: 2016-02-03 | Discharge: 2016-02-04 | Disposition: A | Discharge status: Home / Self Care | Payer: Commercial Managed Care - HMO | Attending: Emergency Medicine | Admitting: Emergency Medicine

## 2016-02-03 ENCOUNTER — Encounter (HOSPITAL_BASED_OUTPATIENT_CLINIC_OR_DEPARTMENT_OTHER): Payer: Self-pay | Admitting: Emergency Medicine

## 2016-02-03 ENCOUNTER — Emergency Department (HOSPITAL_BASED_OUTPATIENT_CLINIC_OR_DEPARTMENT_OTHER): Payer: Commercial Managed Care - HMO

## 2016-02-03 DIAGNOSIS — R509 Fever, unspecified: Secondary | ICD-10-CM | POA: Insufficient documentation

## 2016-02-03 DIAGNOSIS — R51 Headache: Secondary | ICD-10-CM | POA: Diagnosis not present

## 2016-02-03 DIAGNOSIS — F17200 Nicotine dependence, unspecified, uncomplicated: Secondary | ICD-10-CM | POA: Diagnosis not present

## 2016-02-03 DIAGNOSIS — R112 Nausea with vomiting, unspecified: Secondary | ICD-10-CM | POA: Insufficient documentation

## 2016-02-03 DIAGNOSIS — R05 Cough: Secondary | ICD-10-CM | POA: Diagnosis not present

## 2016-02-03 DIAGNOSIS — R6889 Other general symptoms and signs: Secondary | ICD-10-CM

## 2016-02-03 LAB — RAPID STREP SCREEN (MED CTR MEBANE ONLY): Streptococcus, Group A Screen (Direct): NEGATIVE

## 2016-02-03 MED ORDER — IBUPROFEN 400 MG PO TABS
400.0000 mg | ORAL_TABLET | Freq: Once | ORAL | Status: AC | PRN
  Administered 2016-02-03: 400 mg via ORAL
  Filled 2016-02-03: qty 1

## 2016-02-03 NOTE — ED Notes (Addendum)
No answer when called by Darlys GalesKeli Conner times 3.

## 2016-02-03 NOTE — ED Notes (Signed)
ED Provider at bedside. 

## 2016-02-03 NOTE — ED Triage Notes (Signed)
Pt reports congestion with productive cough and sore throat for past couple days.  Pt states fever yesterday, but no known fever today.

## 2016-02-03 NOTE — ED Provider Notes (Signed)
[facility] Provider Note   CSN: [id number] Arrival date & time: 02/03/16  1743  By signing my name below, I, Doreatha Martin, attest that this documentation has been prepared under the direction and in the presence of  [facility] M. Damian Leavell, NP. Electronically Signed: Doreatha Martin, ED Scribe. 02/04/16. 12:04 AM.    History   Chief Complaint Chief Complaint  Patient presents with  . Sore Throat  . Cough    HPI Donna Mosley is a 41 y.o. female who presents to the Emergency Department complaining of moderate, gradually worsening sore throat that began 6 days ago with associated productive cough with yellow mucous, HA, generalized myalgias. She also reports fever (Tmax 101), nausea and emesis that began yesterday. Pt states her sore throat is worsened with coughing and swallowing. Per pt, she has been taking Theraflu, Tylenol, ibuprofen, Aleve with no relief of symptoms. Pt reports recent sick contact with strep throat. She denies abdominal pain, additional complaints or concerns.   The history is provided by the patient. No language interpreter was used.  Sore Throat  This is a new problem. The current episode started more than 2 days ago. The problem occurs constantly. The problem has been gradually worsening. Associated symptoms include headaches. Pertinent negatives include no abdominal pain. The symptoms are aggravated by coughing and swallowing. Nothing relieves the symptoms. She has tried acetaminophen for the symptoms. The treatment provided no relief.  Cough  Associated symptoms include headaches, sore throat and myalgias.    Past Medical History:  Diagnosis Date  . Anemia     There are no active problems to display for this patient.   Past Surgical History:  Procedure Laterality Date  . TUBAL LIGATION    . TUBAL LIGATION    . WISDOM TOOTH EXTRACTION      OB History    No data available       Home Medications    Prior to Admission medications     Medication Sig Start Date End Date Taking? Authorizing Provider  albuterol (PROVENTIL HFA;VENTOLIN HFA) 108 (90 BASE) MCG/ACT inhaler Inhale 2 puffs into the lungs every 2 (two) hours as needed for wheezing or shortness of breath (cough). 05/29/13  Yes Wayland Salinas, MD  Vitamin D, Ergocalciferol, (DRISDOL) 50000 UNITS CAPS Take 50,000 Units by mouth.   Yes Historical Provider, MD  chlorpheniramine-HYDROcodone (TUSSIONEX PENNKINETIC ER) 10-8 MG/5ML SUER Take 5 mLs by mouth every 12 (twelve) hours as needed for cough. 02/04/16   Yenty Bloch Orlene Och, NP  citalopram (CELEXA) 10 MG tablet Take 1 tablet (10 mg total) by mouth daily. 12/01/15   Sharlene Dory, DO  predniSONE (DELTASONE) 10 MG tablet Take 2 tablets (20 mg total) by mouth 2 (two) times daily with a meal. 02/04/16   Kypton Eltringham Orlene Och, NP    Family History No family history on file.  Social History Social History  Substance Use Topics  . Smoking status: Light Tobacco Smoker  . Smokeless tobacco: Never Used  . Alcohol use Yes     Comment: occ     Allergies   Sulfa antibiotics   Review of Systems Review of Systems  Constitutional: Positive for fever.  HENT: Positive for sore throat.   Respiratory: Positive for cough.   Gastrointestinal: Positive for nausea and vomiting. Negative for abdominal pain.  Musculoskeletal: Positive for myalgias.  Skin: Negative for rash.  Neurological: Positive for headaches.  Psychiatric/Behavioral: Negative for confusion.     Physical Exam Updated Vital Signs BP  132/76 (BP Location: Right Arm)   Pulse 70   Temp 98 F (36.7 C) (Oral)   Resp 20   Ht 5\' 4"  (1.626 m)   Wt 80.3 kg   LMP 12/04/2015 Comment: tubal ligation  SpO2 99%   BMI 30.38 kg/m   Physical Exam  Constitutional: She is oriented to person, place, and time. She appears well-developed and well-nourished. No distress.  HENT:  Head: Normocephalic.  Right Ear: Tympanic membrane normal.  Left Ear: Tympanic membrane normal.   Nose: Mucosal edema present.  Mouth/Throat: Uvula is midline, oropharynx is clear and moist and mucous membranes are normal. No posterior oropharyngeal edema or posterior oropharyngeal erythema.  Uvula midline. No edema or erythema. TMs clear bilaterally.   Eyes: Conjunctivae and EOM are normal. Pupils are equal, round, and reactive to light. No scleral icterus.  Neck: Neck supple.  Mild anterior cervical lymphadenopathy.   Cardiovascular: Normal rate and regular rhythm.   Pulmonary/Chest: Effort normal and breath sounds normal. No respiratory distress. She has no wheezes. She has no rales.  Abdominal: Soft. There is no tenderness.  Musculoskeletal: Normal range of motion.  Lymphadenopathy:    She has cervical adenopathy.  Neurological: She is alert and oriented to person, place, and time.  Skin: Skin is warm and dry.  Psychiatric: She has a normal mood and affect. Her behavior is normal.  Nursing note and vitals reviewed.    ED Treatments / Results   DIAGNOSTIC STUDIES: Oxygen Saturation is 100% on RA, normal by my interpretation.    COORDINATION OF CARE: 12:01 AM Discussed treatment plan with pt at bedside which includes CXR, rapid strep and pt agreed to plan.    Labs (all labs ordered are listed, but only abnormal results are displayed) Labs Reviewed  RAPID STREP SCREEN (NOT AT [facility])  CULTURE, GROUP A STREP [facility])    Radiology Dg Chest 2 View  Result Date: 02/03/2016 CLINICAL DATA:  Sore throat with cough and fever EXAM: CHEST  2 VIEW COMPARISON:  05/29/2013 FINDINGS: No acute infiltrate or effusion. Borderline cardiomegaly. No edema. No pneumothorax. IMPRESSION: No active cardiopulmonary disease. Electronically Signed   By: Jasmine PangKim  Fujinaga M.D.   On: 02/03/2016 23:17    Procedures Procedures (including critical care time)  Medications Ordered in ED Medications  ibuprofen (ADVIL,MOTRIN) tablet 400 mg (400 mg Oral Given 02/03/16 2048)     Initial Impression /  Assessment and Plan / ED Course  I have reviewed the triage vital signs and the nursing notes.  Pertinent labs & imaging results that were available during my care of the patient were reviewed by me and considered in my medical decision making (see chart for details).    Patient with symptoms consistent with influenza.  Vitals are stable, low-grade fever.  No signs of dehydration, tolerating PO's.  Lungs are clear. The patient understands that symptoms are greater than the recommended 24-48 hour window of treatment.  Patient will be discharged with instructions to orally hydrate, rest, and use over-the-counter medications such as anti-inflammatories ibuprofen and Aleve for muscle aches and Tylenol for fever.  Patient will also be given a cough suppressant and short course of prednisone.   Final Clinical Impressions(s) / ED Diagnoses   Final diagnoses:  Flu-like symptoms    New Prescriptions New Prescriptions   CHLORPHENIRAMINE-HYDROCODONE (TUSSIONEX PENNKINETIC ER) 10-8 MG/5ML SUER    Take 5 mLs by mouth every 12 (twelve) hours as needed for cough.   PREDNISONE (DELTASONE) 10 MG TABLET  Take 2 tablets (20 mg total) by mouth 2 (two) times daily with a meal.   I personally performed the services described in this documentation, which was scribed in my presence. The recorded information has been reviewed and is accurate.    [place], [place] 02/04/16 0012    Cy Blamer, MD 02/04/16 [phone]

## 2016-02-03 NOTE — ED Notes (Addendum)
Pt returned to desk. Placed in FT to be seen.

## 2016-02-04 MED ORDER — PREDNISONE 10 MG PO TABS: 20.0000 mg | ORAL_TABLET | Freq: Two times a day (BID) | ORAL | 0 refills | Status: DC

## 2016-02-04 MED ORDER — HYDROCOD POLST-CPM POLST ER 10-8 MG/5ML PO SUER: 5.0000 mL | Freq: Two times a day (BID) | ORAL | 0 refills | Status: DC | PRN

## 2016-02-04 NOTE — ED Notes (Signed)
Pt c/o ST, cough, fever 101, N/V x 1 week

## 2016-02-04 NOTE — ED Notes (Signed)
Pt given d/c instructions as per chart. Rx x 2. Verbalizes understanding. No questions. 

## 2016-02-06 ENCOUNTER — Telehealth: Payer: Self-pay | Admitting: Family Medicine

## 2016-02-06 LAB — CULTURE, GROUP A STREP (THRC)

## 2016-02-06 NOTE — Telephone Encounter (Signed)
Ok, thank you

## 2016-02-06 NOTE — Telephone Encounter (Signed)
Donna Mosley-- pt is due for a follow up with Dr Carmelia RollerWendling if she is planning to continue seeing him for PCP. If she confirms, please schedule her for a 30 minute appt with Dr Carmelia RollerWendling for pap and f/u of anxiety and depression. If she is not planning to continue seeing him as PCP please remove him from PCP and let us know. Thanks!

## 2016-02-06 NOTE — Telephone Encounter (Signed)
Pt was supposed to follow up a couple times with me and ended up cancelling. Can we make sure that she is still interested in seeing me, and if not, take my name off her chart as PCP. TY.

## 2016-02-06 NOTE — Telephone Encounter (Signed)
Patient is scheduled for appt on 02/22/16. She states that she had to cancel due to her work schedule but she would like to still see Dr. Carmelia RollerWendling still. She is off work this week but it is due to the flu so she will not be able to come in this week.

## 2016-02-22 ENCOUNTER — Ambulatory Visit (INDEPENDENT_AMBULATORY_CARE_PROVIDER_SITE_OTHER): Payer: Commercial Managed Care - HMO | Admitting: Family Medicine

## 2016-02-22 ENCOUNTER — Encounter: Payer: Self-pay | Admitting: Family Medicine

## 2016-02-22 VITALS — BP 108/78 | HR 69 | Temp 97.9°F | Ht 64.0 in | Wt 174.4 lb

## 2016-02-22 DIAGNOSIS — F4323 Adjustment disorder with mixed anxiety and depressed mood: Secondary | ICD-10-CM

## 2016-02-22 MED ORDER — TRAZODONE HCL 50 MG PO TABS: 50.0000 mg | ORAL_TABLET | Freq: Every day | ORAL | 1 refills | Status: DC

## 2016-02-22 NOTE — Progress Notes (Signed)
Chief Complaint  Patient presents with  . Follow-up    on anxiety and dicuss periods-pt stated she has not taking the Celexa since the beginning of Jan it was causing her not to sleep.    Subjective Donna Mosley presents for f/u anxiety/depression. Diagnosed in Nov- found her husband had been cheating on her Has tried Celexa- did not do well this time around as it kept her awake. Has failed Celexa. She does not follow with a counselor or psychologist. She is not currently taking any medications. She is having difficulty sleeping in addition to generalized anxiety and depression. No thoughts of harming self or others. No self-medication with alcohol, prescription drugs or illicit drugs.  ROS Psych: No homicidal or suicidal thoughts  Past Medical History:  Diagnosis Date  . Anemia    No family history on file. Allergies as of 02/22/2016      Reactions   Sulfa Antibiotics Hives      Medication List       Accurate as of 02/22/16 12:02 PM. Always use your most recent med list.          albuterol 108 (90 Base) MCG/ACT inhaler Commonly known as:  PROVENTIL HFA;VENTOLIN HFA Inhale 2 puffs into the lungs every 2 (two) hours as needed for wheezing or shortness of breath (cough).   traZODone 50 MG tablet Commonly known as:  DESYREL Take 1 tablet (50 mg total) by mouth at bedtime. 1/2 tab daily for the first 2 weeks.       Exam BP 108/78 (BP Location: Left Arm, Patient Position: Sitting, Cuff Size: Normal)   Pulse 69   Temp 97.9 F (36.6 C) (Oral)   Ht 5\' 4"  (1.626 m)   Wt 174 lb 6.4 oz (79.1 kg)   LMP 02/04/2016 (Exact Date)   SpO2 98%   BMI 29.94 kg/m  General:  well developed, well nourished, in no apparent distress Neck: neck supple without adenopathy, thyromegaly, or masses Lungs:  clear to auscultation, breath sounds equal bilaterally, no respiratory distress Cardio:  regular rate and rhythm without murmurs, heart sounds without clicks or rubs Psych: well  oriented with normal range of affect and age-appropriate judgement/insight, alert and oriented x4.  Assessment and Plan  Situational mixed anxiety and depressive disorder - Plan: traZODone (DESYREL) 50 MG tablet  Orders as above. Hopefully will get some relief with both sleep and anxiety/depression. Number for counseling/CBT given and encouraged that she consider it.  Counseled on diet and exercise. F/u in 6 weeks. The patient voiced understanding and agreement to the plan.  Jilda Rocheicholas Paul [place], DO 02/22/16 12:02 PM

## 2016-02-22 NOTE — Progress Notes (Signed)
Pre visit review using our clinic review tool, if applicable. No additional management support is needed unless otherwise documented below in the visit note. 

## 2016-02-22 NOTE — Patient Instructions (Addendum)
Call your previous PCP's office and ask about your last pap smear. See if they also tested for HPV and if everything was normal.   Please consider counseling/cognitive behavioral therapy. The medical literature and evidence-based guidelines support it. Contact [phone] to schedule an appointment or inquire about cost/insurance coverage.

## 2016-09-20 ENCOUNTER — Ambulatory Visit (INDEPENDENT_AMBULATORY_CARE_PROVIDER_SITE_OTHER): Payer: 59 | Admitting: Family Medicine

## 2016-09-20 ENCOUNTER — Encounter: Payer: Self-pay | Admitting: Family Medicine

## 2016-09-20 VITALS — BP 108/80 | HR 77 | Temp 98.6°F | Ht 64.0 in | Wt 184.5 lb

## 2016-09-20 DIAGNOSIS — J309 Allergic rhinitis, unspecified: Secondary | ICD-10-CM

## 2016-09-20 MED ORDER — LEVOCETIRIZINE DIHYDROCHLORIDE 5 MG PO TABS: 5.0000 mg | ORAL_TABLET | Freq: Every evening | ORAL | 2 refills | Status: DC

## 2016-09-20 MED ORDER — FLUTICASONE PROPIONATE 50 MCG/ACT NA SUSP: 2.0000 | Freq: Every day | NASAL | 2 refills | Status: DC

## 2016-09-20 NOTE — Patient Instructions (Signed)
Claritin (loratadine), Allegra (fexofenadine), Zyrtec (cetirizine); these are listed in order from weakest to strongest. Generic, and therefore cheaper, options are in the parentheses.   Flonase (fluticasone); nasal spray that is over the counter. 2 sprays each nostril, once daily. Aim towards the same side eye when you spray.  There are available OTC, and the generic versions, which may be cheaper, are in parentheses. Show this to a pharmacist if you have trouble finding any of these items.  Call/send MyChart message in 1 week if you are not better. Contact me sooner if you are getting worse.

## 2016-09-20 NOTE — Progress Notes (Signed)
Pre visit review using our clinic review tool, if applicable. No additional management support is needed unless otherwise documented below in the visit note. 

## 2016-09-20 NOTE — Progress Notes (Signed)
Chief Complaint  Patient presents with  . Sinusitis    Donna Mosley here for URI complaints.  Duration: 2 days  Associated symptoms: sinus congestion, rhinorrhea, itchy watery eyes, sore throat fever (101F), myalgia Denies: sinus pain, red eyes, ear pain, ear drainage, wheezing, shortness of breath, dental pain and rigors Treatment to date: Tylenol Sick contacts: No  ROS:  Const: +fevers HEENT: As noted in HPI Lungs: No SOB   Past Medical History:  Diagnosis Date  . Anemia    BP 108/80 (BP Location: Left Arm, Patient Position: Sitting, Cuff Size: Normal)   Pulse 77   Temp 98.6 F (37 C) (Oral)   Ht 5\' 4"  (1.626 m)   Wt 184 lb 8 oz (83.7 kg)   SpO2 99%   BMI 31.67 kg/m  General: Awake, alert, appears stated age HEENT: AT, [place], ears patent b/l and TM's neg, nares patent w/o discharge, pharynx pink and without exudates, MMM Neck: No masses or asymmetry Heart: RRR, no murmurs, no bruits Lungs: CTAB, no accessory muscle use Psych: Age appropriate judgment and insight, normal mood and affect  Allergic rhinitis, unspecified seasonality, unspecified trigger - Plan: levocetirizine (XYZAL) 5 MG tablet, fluticasone (FLONASE) 50 MCG/ACT nasal spray  Orders as above. Continue to push fluids, practice good hand hygiene, cover mouth when coughing. F/u prn. If starting to experience fevers, shaking, or shortness of breath, seek immediate care. Pt voiced understanding and agreement to the plan.  Jilda Rocheicholas Paul [place], DO 09/20/16 8:41 AM

## 2016-11-16 ENCOUNTER — Ambulatory Visit (INDEPENDENT_AMBULATORY_CARE_PROVIDER_SITE_OTHER): Payer: 59 | Admitting: Family Medicine

## 2016-11-16 ENCOUNTER — Encounter: Payer: Self-pay | Admitting: Family Medicine

## 2016-11-16 ENCOUNTER — Telehealth: Payer: Self-pay | Admitting: Family Medicine

## 2016-11-16 VITALS — BP 130/88 | HR 76 | Temp 98.0°F | Wt 182.0 lb

## 2016-11-16 DIAGNOSIS — R6889 Other general symptoms and signs: Secondary | ICD-10-CM

## 2016-11-16 DIAGNOSIS — R05 Cough: Secondary | ICD-10-CM

## 2016-11-16 DIAGNOSIS — R059 Cough, unspecified: Secondary | ICD-10-CM

## 2016-11-16 LAB — POC INFLUENZA A&B (BINAX/QUICKVUE)
INFLUENZA B, POC: NEGATIVE
Influenza A, POC: NEGATIVE

## 2016-11-16 MED ORDER — OSELTAMIVIR PHOSPHATE 75 MG PO CAPS: 75.0000 mg | ORAL_CAPSULE | Freq: Two times a day (BID) | ORAL | 0 refills | Status: DC

## 2016-11-16 NOTE — Patient Instructions (Signed)
You may have flu vs another type of viral infection We will use tamiflu twice a day for 5 days for possible flu For your nasal congestion, try afrin nasal spray as needed for a few days - stop after 5 days or so Continue theraflu or other pain and fever reducers of your choice as needed If you are not feeling better in the next few days please seek care- Sooner if worse.

## 2016-11-16 NOTE — Telephone Encounter (Signed)
[facility] Night - Client TELEPHONE ADVICE RECORD [facility]  Patient Name: Sheilah MinsAMARA Peeler  DOB: 10-15-1975    Initial Comment Caller states she has fever, stuffiness, achy, coughing, and sore throat. She is wondering about seeing someone today, or what to do. Temperature last night: 101.1.   Nurse Assessment  Nurse: Odis LusterBowers, RN, Bjorn Loserhonda Date/Time (Eastern Time): 11/16/2016 10:50:11 AM  Confirm and document reason for call. If symptomatic, describe symptoms. ---Caller states she has fever, stuffiness, achy, coughing, and sore throat. She is wondering about seeing someone today, or what to do. Temperature last night: 101.1. Wondering if she has the flu. Didn't get a flu shot this season.  Does the patient have any new or worsening symptoms? ---Yes  Will a triage be completed? ---Yes  Related visit to physician within the last 2 weeks? ---No  Does the PT have any chronic conditions? (i.e. diabetes, asthma, etc.) ---No  Is the patient pregnant or possibly pregnant? (Ask all females between the ages of 4912-55) ---No  Is this a behavioral health or substance abuse call? ---No     Guidelines    Guideline Title Affirmed Question Affirmed Notes  Influenza - Seasonal [1] Patient is NOT HIGH RISK AND [2] strongly requests antiviral medicine AND [3] flu symptoms present < 48 hours    Final Disposition User   Call PCP within 24 Hours Odis LusterBowers, RN, Bjorn Loserhonda    Comments  Scheduled caller for appt [facility] 12:30p [facility] with Dr. Dallas Schimkeopeland for today.   Referrals  [facility]   Caller Disagree/Comply Comply  Caller Understands Yes  PreDisposition Did not know what to do

## 2016-11-16 NOTE — Progress Notes (Signed)
[facility] [address], [address] [place], [place] [address] [phone]  Date:  11/16/2016   Name:  Donna Mosley   DOB:  Oct 26, 1975   MRN:  [id number]  PCP:  Sharlene Dory, DO    Chief Complaint: Cough (Symptoms since Thursday. )   History of Present Illness:  Donna Mosley is a 41 y.o. 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   Past Medical History:  Diagnosis Date  . Anemia     Past Surgical History:  Procedure Laterality Date  . TUBAL LIGATION    . TUBAL LIGATION    . WISDOM TOOTH EXTRACTION      Social History  Substance Use Topics  . Smoking status: Former Smoker    Types: Cigarettes    Quit date: 02/01/2016  . Smokeless tobacco: Never Used  . Alcohol use Yes     Comment: occ    No family history on file.  Allergies  Allergen Reactions  . Sulfa Antibiotics Hives    Medication list has been reviewed and updated.  Current Outpatient Prescriptions on File Prior to Visit  Medication Sig Dispense Refill  . albuterol (PROVENTIL HFA;VENTOLIN HFA) 108 (90 BASE) MCG/ACT inhaler Inhale 2 puffs into the lungs every 2 (two) hours as needed for wheezing or shortness of breath (cough). 1 Inhaler 0  . fluticasone (FLONASE) 50 MCG/ACT nasal spray Place 2 sprays into both nostrils daily.  16 g 2  . levocetirizine (XYZAL) 5 MG tablet Take 1 tablet (5 mg total) by mouth every evening. 30 tablet 2  . traZODone (DESYREL) 50 MG tablet Take 1 tablet (50 mg total) by mouth at bedtime. 1/2 tab daily for the first 2 weeks. 30 tablet 1   No current facility-administered medications on file prior to visit.     Review of Systems:  As per HPI- otherwise negative. No nausea or vomiting S/p BTL  Physical Examination: Vitals:   11/16/16 1218  BP: 130/88  Pulse: 76  Temp: 98 F (36.7 C)  SpO2: 100%   Vitals:   11/16/16 1218  Weight: 182 lb (82.6 kg)   Body mass index is 31.24 kg/m. Ideal Body Weight:    GEN: WDWN, NAD, Non-toxic, A & O x 3 HEENT: Atraumatic, Normocephalic. Neck supple. No masses, No LAD.  Bilateral TM wnl, oropharynx normal.  PEERL,EOMI.   Ears and Nose: No external deformity. CV: RRR, No M/G/R. No JVD. No thrill. No extra heart sounds. PULM: CTA B, no wheezes, crackles, rhonchi. No retractions. No resp. distress. No accessory muscle use. ABD: S, NT, ND, +BS. No rebound. No HSM. EXTR: No c/c/e NEURO Normal gait.  PSYCH: Normally interactive. Conversant. Not depressed or anxious appearing.  Calm demeanor.  Overweight, appears to not feel great, nasal congestion  Results for orders placed or performed in visit on 11/16/16  POC Influenza A&B (Binax test)  Result Value Ref Range   Influenza A, POC Negative Negative   Influenza B, POC Negative Negative     Assessment and Plan: Flu-like symptoms - Plan: oseltamivir (TAMIFLU) 75 MG capsule  Cough - Plan: POC Influenza A&B (Binax test)  Pt would like to use tamiflu for possible flu She will try afrin as needed for her nasal congestion Follow-up if not better soon  Signed Abbe AmsterdamJessica Sherman Lipuma, MD

## 2017-06-16 IMAGING — MG DIGITAL DIAGNOSTIC UNILATERAL RIGHT MAMMOGRAM
3 series · 3 of 3 positions shown · non-contrast
Comparison: Baseline screening mammogram dated 12/14/2015.

CLINICAL DATA: Patient returns today to evaluate right breast
calcifications identified on baseline screening mammogram.

EXAM:
DIGITAL DIAGNOSTIC RIGHT MAMMOGRAM WITH CAD

[R ML (1 of 2)]
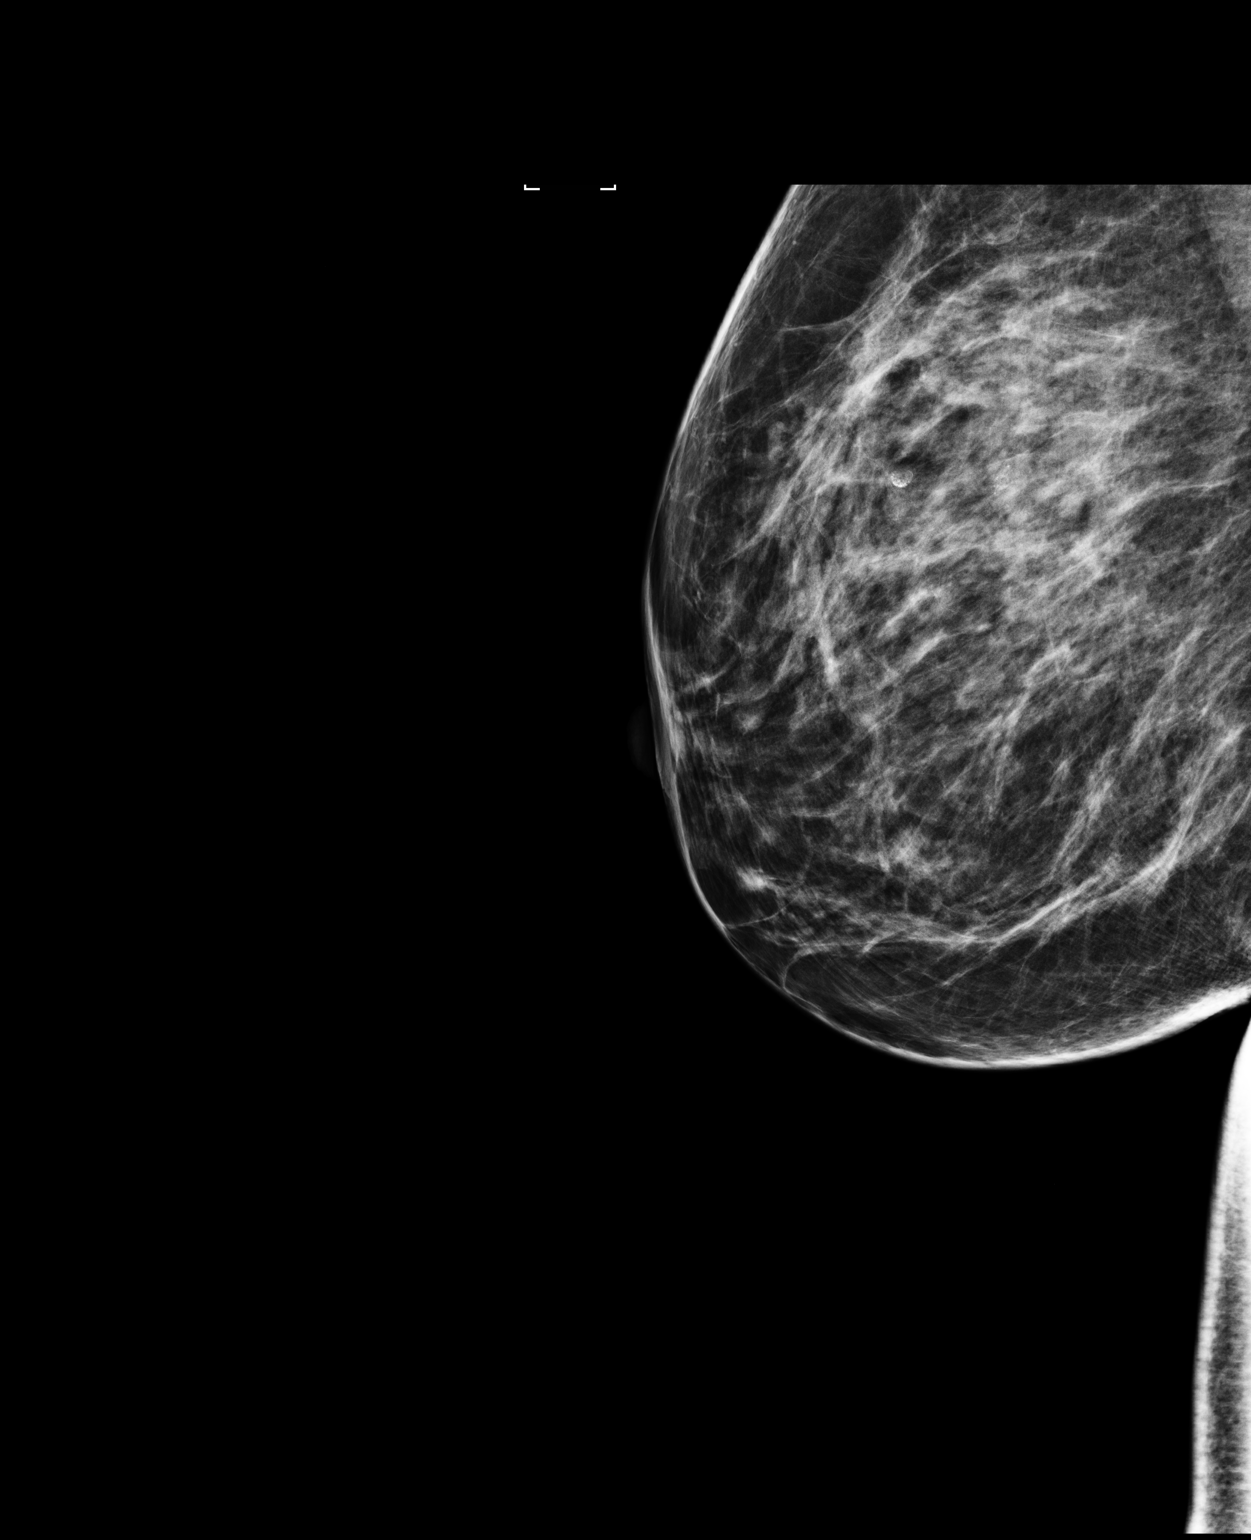

[R ML (2 of 2)]
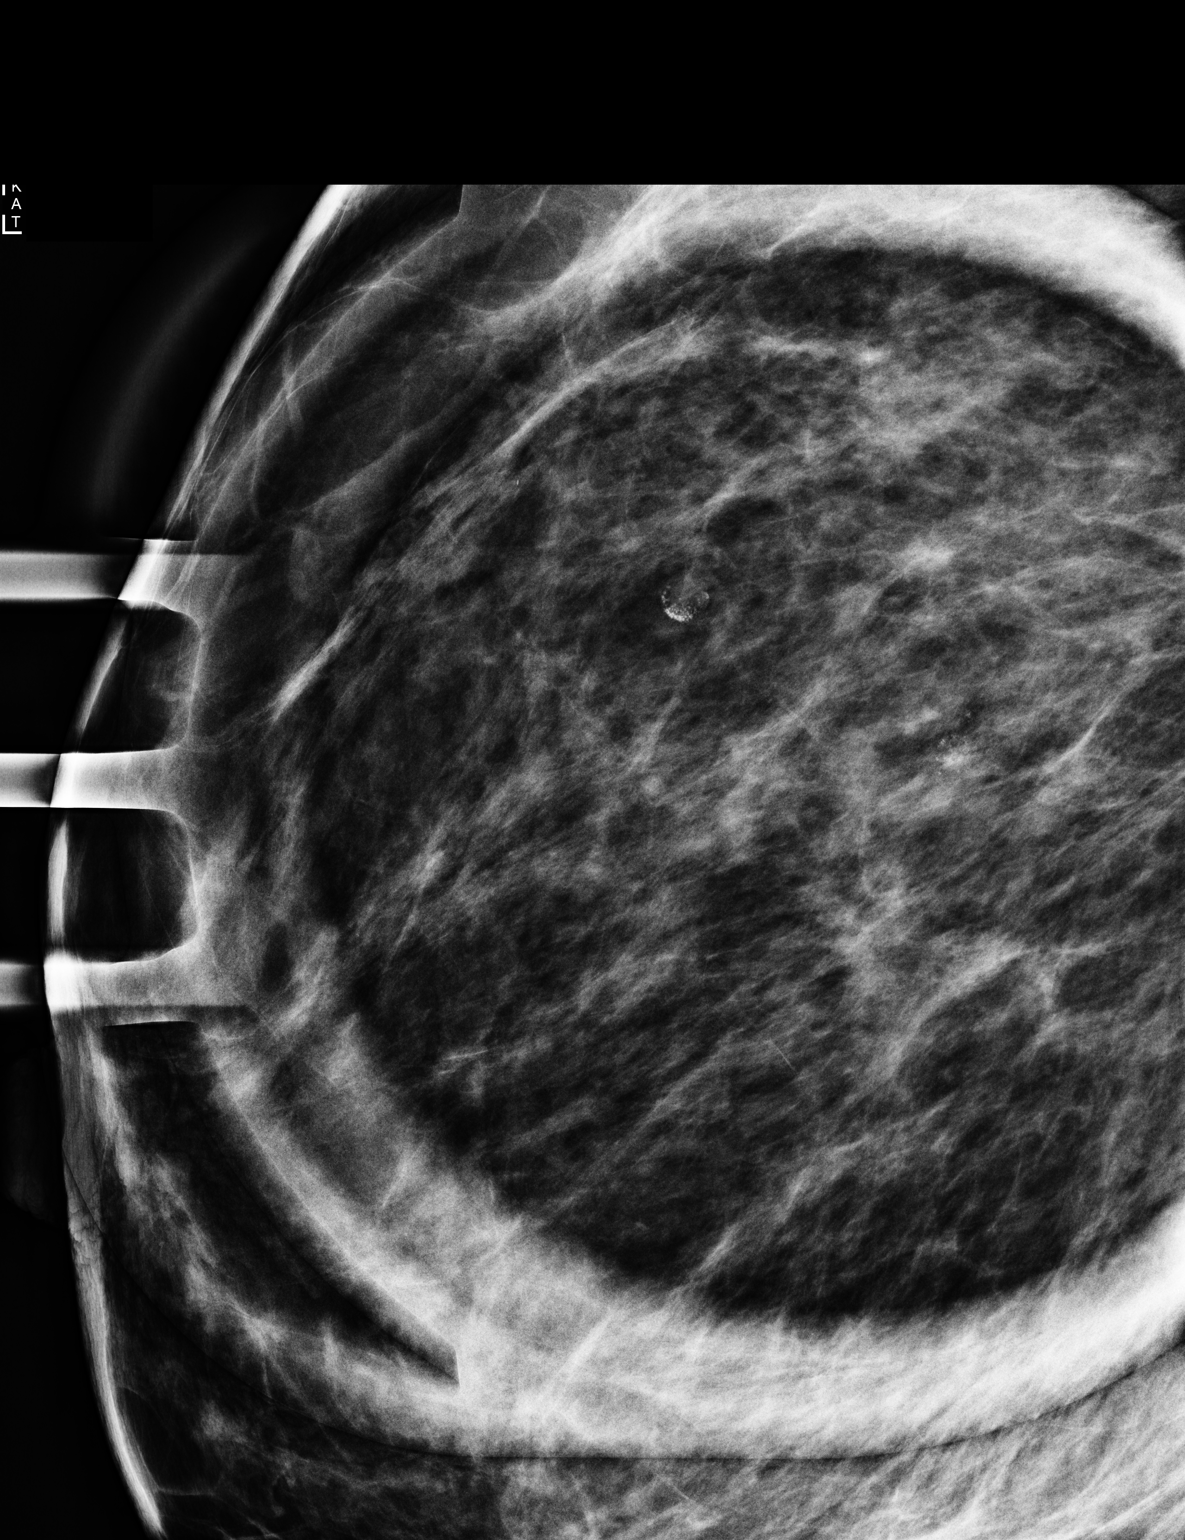

[R CC]
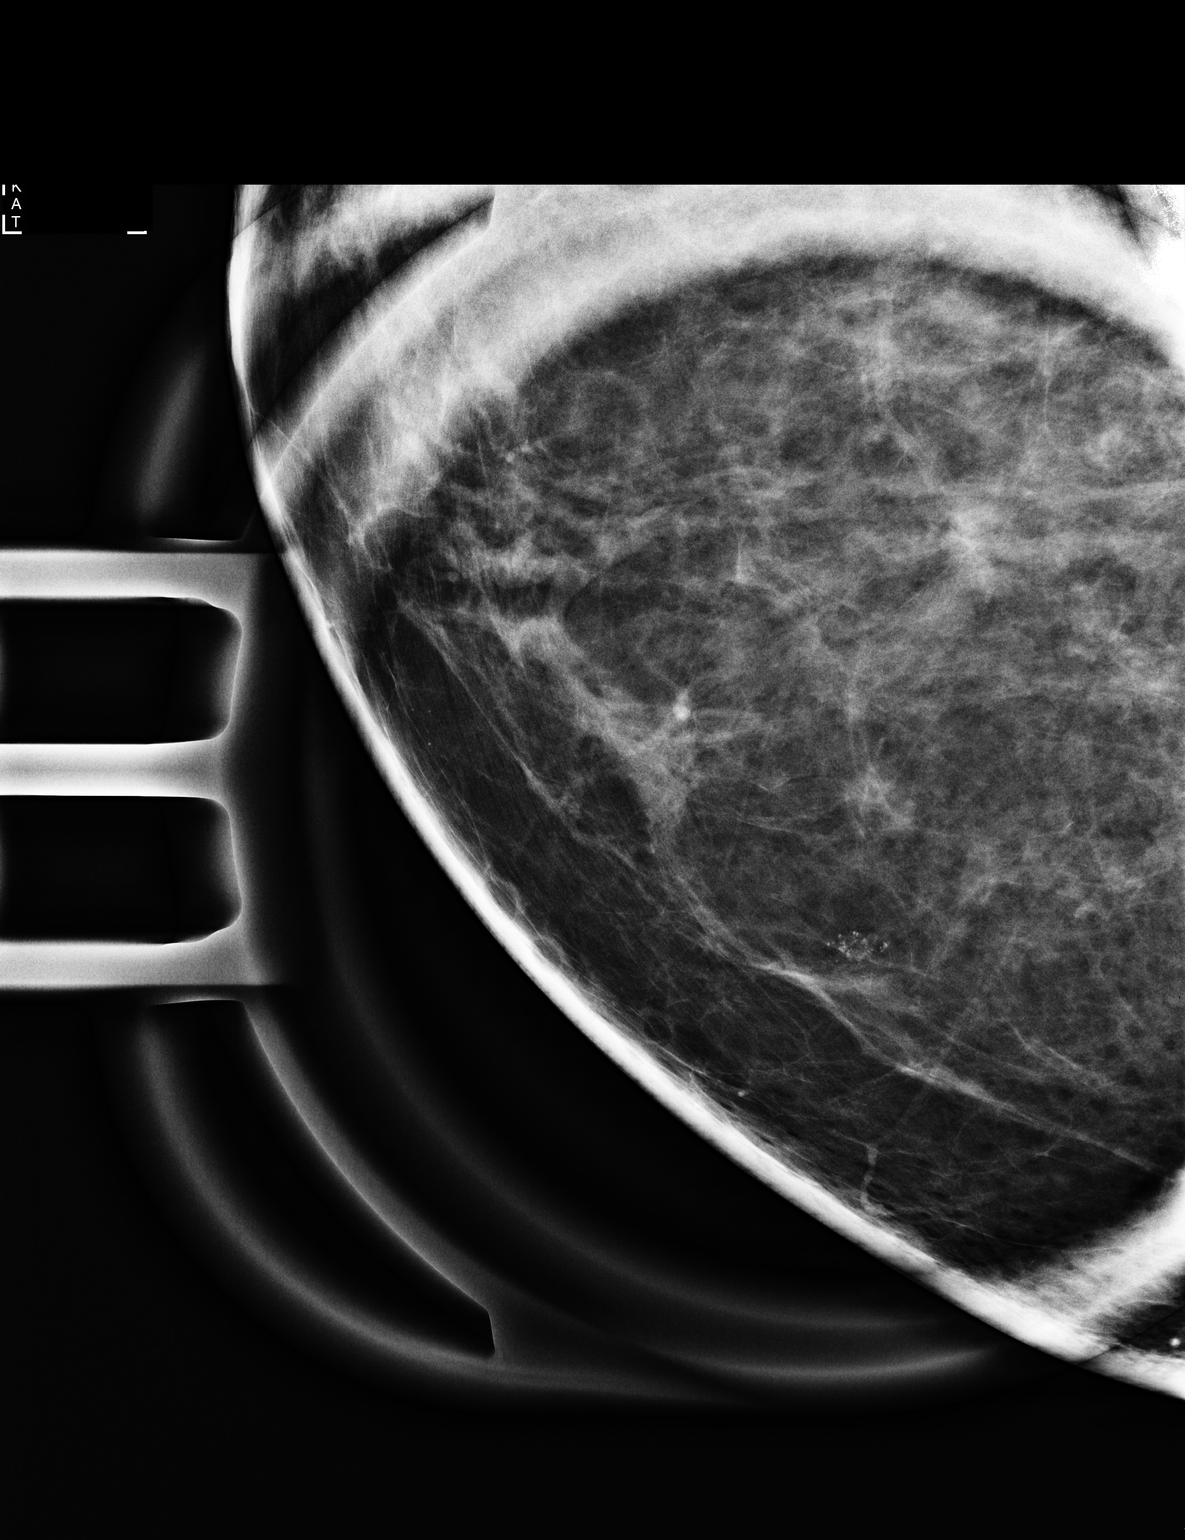

[3 of 3 positions shown; findings below may reference images not displayed]

ACR Breast Density Category c: The breast tissue is heterogeneously
dense, which may obscure small masses.
FINDINGS: On today's additional views with magnification, grouped punctate and
coarse calcifications are confirmed within the upper inner quadrant
of the right breast, spanning approximately 6 mm, likely related to
an underlying oil cyst, with additional benign oil cyst also seen
within the upper-outer quadrant. No suspicious pleomorphic or linear
branching calcifications are identified.

Mammographic images were processed with CAD.
IMPRESSION: Probably benign calcifications within the upper inner quadrant of
the right breast, favor benign oil cyst. Recommend follow-up right
breast diagnostic mammogram in 6 months to ensure stability.

RECOMMENDATION:
Follow-up right breast diagnostic mammogram, with magnification
views, in 6 months.

I have discussed the findings and recommendations with the patient.
Results were also provided in writing at the conclusion of the
visit. If applicable, a reminder letter will be sent to the patient
regarding the next appointment.

BI-RADS CATEGORY  3: Probably benign.

## 2018-01-26 ENCOUNTER — Other Ambulatory Visit (HOSPITAL_BASED_OUTPATIENT_CLINIC_OR_DEPARTMENT_OTHER): Payer: Self-pay | Admitting: Family Medicine

## 2018-01-26 DIAGNOSIS — Z1231 Encounter for screening mammogram for malignant neoplasm of breast: Secondary | ICD-10-CM

## 2018-01-28 ENCOUNTER — Encounter: Payer: Self-pay | Admitting: Family Medicine

## 2018-01-28 ENCOUNTER — Ambulatory Visit (INDEPENDENT_AMBULATORY_CARE_PROVIDER_SITE_OTHER): Payer: 59 | Admitting: Family Medicine

## 2018-01-28 VITALS — BP 118/70 | HR 70 | Temp 98.2°F | Ht 64.0 in | Wt 194.2 lb

## 2018-01-28 DIAGNOSIS — R928 Other abnormal and inconclusive findings on diagnostic imaging of breast: Secondary | ICD-10-CM | POA: Diagnosis not present

## 2018-01-28 DIAGNOSIS — K635 Polyp of colon: Secondary | ICD-10-CM | POA: Diagnosis not present

## 2018-01-28 DIAGNOSIS — Z720 Tobacco use: Secondary | ICD-10-CM | POA: Diagnosis not present

## 2018-01-28 DIAGNOSIS — Z23 Encounter for immunization: Secondary | ICD-10-CM | POA: Diagnosis not present

## 2018-01-28 DIAGNOSIS — Z0000 Encounter for general adult medical examination without abnormal findings: Secondary | ICD-10-CM | POA: Diagnosis not present

## 2018-01-28 MED ORDER — BUPROPION HCL ER (SR) 150 MG PO TB12: ORAL_TABLET | ORAL | 3 refills | Status: DC

## 2018-01-28 NOTE — Progress Notes (Signed)
Pre visit review using our clinic review tool, if applicable. No additional management support is needed unless otherwise documented below in the visit note. 

## 2018-01-28 NOTE — Patient Instructions (Addendum)
Give [place] 2-3 business days to get the results of your labs back.   If you do not hear anything about your referrals in the next 1-2 weeks, call our office and ask for an update.  For the bupropion (for smoking), pick a quit date and start 1 week beforehand.  Keep the diet clean and stay active.  Aim to do some physical exertion for 150 minutes per week. This is typically divided into 5 days per week, 30 minutes per day. The activity should be enough to get your heart rate up. Anything is better than nothing if you have time constraints. Consider lifting weights.   Call Center for [facility] at [facility] at [phone] for an appointment.  They are located at [address], [place], [place], [address] (right across the hall from our office).  Let us know if you need anything.

## 2018-01-28 NOTE — Addendum Note (Signed)
Addended by: Scharlene Gloss B on: 01/28/2018 03:26 PM   Modules accepted: Orders

## 2018-01-28 NOTE — Progress Notes (Signed)
Chief Complaint  Patient presents with  . Annual Exam     Well Woman Donna Mosley is here for a complete physical.   Her last physical was >1 year ago.  Current diet: in general, a "healthy" diet. Current exercise: walking Weight is stable and she denies daytime fatigue. No LMP recorded. (Menstrual status: Irregular Periods)..  Seatbelt? Yes  Health Maintenance Pap/HPV- Due Mammogram- Yes Tetanus- No HIV screening- Yes  Past Medical History:  Diagnosis Date  . Anemia      Past Surgical History:  Procedure Laterality Date  . TUBAL LIGATION    . TUBAL LIGATION    . WISDOM TOOTH EXTRACTION      Medications  Takes no meds routinely.   Allergies Allergies  Allergen Reactions  . Sulfa Antibiotics Hives    Review of Systems: Constitutional:  no unexpected weight changes Eye:  no recent significant change in vision Ear/Nose/Mouth/Throat:  Ears:  no tinnitus or vertigo and no recent change in hearing Nose/Mouth/Throat:  no complaints of nasal congestion, no sore throat Cardiovascular: no chest pain Respiratory:  no cough and no shortness of breath Gastrointestinal:  no abdominal pain, no change in bowel habits GU:  Female: negative for dysuria or pelvic pain Musculoskeletal/Extremities:  no pain of the joints Integumentary (Skin/Breast):  no abnormal skin lesions reported Neurologic:  no headaches Endocrine:  denies fatigue Hematologic/Lymphatic:  No areas of easy bleeding  Exam BP 118/70 (BP Location: Left Arm, Patient Position: Sitting, Cuff Size: Normal)   Pulse 70   Temp 98.2 F (36.8 C) (Oral)   Ht 5\' 4"  (1.626 m)   Wt 194 lb 4 oz (88.1 kg)   SpO2 97%   BMI 33.34 kg/m  General:  well developed, well nourished, in no apparent distress Skin:  no significant moles, warts, or growths Head:  no masses, lesions, or tenderness Eyes:  pupils equal and round, sclera anicteric without injection Ears:  canals without lesions, TMs shiny without retraction,  no obvious effusion, no erythema Nose:  nares patent, septum midline, mucosa normal, and no drainage or sinus tenderness Throat/Pharynx:  lips and gingiva without lesion; tongue and uvula midline; non-inflamed pharynx; no exudates or postnasal drainage Neck: neck supple without adenopathy, thyromegaly, or masses Lungs:  clear to auscultation, breath sounds equal bilaterally, no respiratory distress Cardio:  regular rate and rhythm, no bruits, no LE edema Abdomen:  abdomen soft, nontender; bowel sounds normal; no masses or organomegaly Genital: Defer to GYN Musculoskeletal:  symmetrical muscle groups noted without atrophy or deformity Extremities:  no clubbing, cyanosis, or edema, no deformities, no skin discoloration Neuro:  gait normal; deep tendon reflexes normal and symmetric Psych: well oriented with normal range of affect and appropriate judgment/insight  Assessment and Plan  Well adult exam - Plan: Lipid panel, Comprehensive metabolic panel, CBC  BI-RADS category 3 mammogram result - Plan: MM Digital Diagnostic Unilat R  Polyp of colon, unspecified part of colon, unspecified type - Plan: Ambulatory referral to Gastroenterology  Tobacco abuse - Plan: buPROPion (WELLBUTRIN SR) 150 MG 12 hr tablet   [name]. Counseled on diet and exercise. Wt resistance exercise rec'd. Counseled on smoking cessation. Start bupropion, instructions verbalized and written down. Need to f/u on Birads 3 mammo from 2017. Order placed for GI. She told me she has a hx of colon polyps where it was rec'd she f/u q 3 yrs and it has been over 3 yrs. We will get this set up.  Update imms today.  Other orders as above. Follow up in 3 mo to reck smoking cessation, 1 yr for CPE otherwise. The patient voiced understanding and agreement to the plan.  Donna Roche [place], DO 01/28/18 3:01 PM

## 2018-01-29 ENCOUNTER — Other Ambulatory Visit: Payer: Self-pay | Admitting: Family Medicine

## 2018-01-29 DIAGNOSIS — R718 Other abnormality of red blood cells: Secondary | ICD-10-CM

## 2018-01-29 DIAGNOSIS — E875 Hyperkalemia: Secondary | ICD-10-CM

## 2018-01-29 LAB — COMPREHENSIVE METABOLIC PANEL
ALT: 9 U/L (ref 0–35)
AST: 15 U/L (ref 0–37)
Albumin: 3.8 g/dL (ref 3.5–5.2)
Alkaline Phosphatase: 65 U/L (ref 39–117)
BILIRUBIN TOTAL: 0.4 mg/dL (ref 0.2–1.2)
BUN: 10 mg/dL (ref 6–23)
CHLORIDE: 108 meq/L (ref 96–112)
CO2: 29 meq/L (ref 19–32)
Calcium: 9.2 mg/dL (ref 8.4–10.5)
Creatinine, Ser: 0.77 mg/dL (ref 0.40–1.20)
GFR: 105.24 mL/min (ref >60.00)
GLUCOSE: 67 mg/dL — AB (ref 70–99)
Sodium: 142 mEq/L (ref 135–145)
Total Protein: 6.6 g/dL (ref 6.0–8.3)

## 2018-01-29 LAB — CBC
HEMATOCRIT: 38.9 % (ref 36.0–46.0)
Hemoglobin: 12.4 g/dL (ref 12.0–15.0)
MCHC: 31.9 g/dL (ref 30.0–36.0)
MCV: 74.9 fl — AB (ref 78.0–100.0)
Platelets: 259 10*3/uL (ref 150.0–400.0)
RBC: 5.19 Mil/uL — AB (ref 3.87–5.11)
RDW: 15.9 % — ABNORMAL HIGH (ref 11.5–15.5)
WBC: 6.4 10*3/uL (ref 4.0–10.5)

## 2018-01-29 LAB — LIPID PANEL
CHOL/HDL RATIO: 4
Cholesterol: 170 mg/dL (ref 0–200)
HDL: 44.1 mg/dL (ref >39.00)
LDL Cholesterol: 111 mg/dL — ABNORMAL HIGH (ref 0–99)
NONHDL: 126.31
Triglycerides: 76 mg/dL (ref 0.0–149.0)
VLDL: 15.2 mg/dL (ref 0.0–40.0)

## 2018-01-30 ENCOUNTER — Ambulatory Visit
Admission: RE | Admit: 2018-01-30 | Discharge: 2018-01-30 | Disposition: A | Discharge status: Home / Self Care | Payer: 59 | Source: Ambulatory Visit | Attending: Family Medicine | Admitting: Family Medicine

## 2018-01-30 ENCOUNTER — Other Ambulatory Visit: Payer: Self-pay | Admitting: Family Medicine

## 2018-01-30 ENCOUNTER — Other Ambulatory Visit (INDEPENDENT_AMBULATORY_CARE_PROVIDER_SITE_OTHER): Payer: 59

## 2018-01-30 ENCOUNTER — Ambulatory Visit: Payer: 59

## 2018-01-30 DIAGNOSIS — R718 Other abnormality of red blood cells: Secondary | ICD-10-CM

## 2018-01-30 DIAGNOSIS — E875 Hyperkalemia: Secondary | ICD-10-CM | POA: Diagnosis not present

## 2018-01-30 DIAGNOSIS — R928 Other abnormal and inconclusive findings on diagnostic imaging of breast: Secondary | ICD-10-CM

## 2018-01-30 LAB — BASIC METABOLIC PANEL
BUN: 6 mg/dL (ref 6–23)
CHLORIDE: 106 meq/L (ref 96–112)
CO2: 29 mEq/L (ref 19–32)
CREATININE: 0.75 mg/dL (ref 0.40–1.20)
Calcium: 9.4 mg/dL (ref 8.4–10.5)
GFR: 102.06 mL/min (ref >60.00)
Glucose, Bld: 83 mg/dL (ref 70–99)
POTASSIUM: 4.6 meq/L (ref 3.5–5.1)
Sodium: 142 mEq/L (ref 135–145)

## 2018-01-31 LAB — IRON,TIBC AND FERRITIN PANEL
%SAT: 29 % (calc) (ref 16–45)
Ferritin: 48 ng/mL (ref 16–232)
Iron: 89 ug/dL (ref 40–190)
TIBC: 307 ug/dL (ref 250–450)

## 2018-02-04 ENCOUNTER — Other Ambulatory Visit: Payer: Self-pay | Admitting: Family Medicine

## 2018-02-04 ENCOUNTER — Ambulatory Visit
Admission: RE | Admit: 2018-02-04 | Discharge: 2018-02-04 | Disposition: A | Discharge status: Home / Self Care | Payer: 59 | Source: Ambulatory Visit | Attending: Family Medicine | Admitting: Family Medicine

## 2018-02-04 DIAGNOSIS — R928 Other abnormal and inconclusive findings on diagnostic imaging of breast: Secondary | ICD-10-CM

## 2018-03-02 ENCOUNTER — Ambulatory Visit: Payer: 59 | Admitting: Family Medicine

## 2018-03-17 ENCOUNTER — Ambulatory Visit: Payer: 59 | Admitting: Family

## 2018-04-09 ENCOUNTER — Encounter: Payer: Self-pay | Admitting: Family Medicine

## 2018-05-15 ENCOUNTER — Telehealth: Payer: Self-pay | Admitting: Family Medicine

## 2018-05-15 MED ORDER — CETIRIZINE HCL 10 MG PO TABS: 10.0000 mg | ORAL_TABLET | Freq: Every day | ORAL | 11 refills | Status: DC

## 2018-05-15 MED ORDER — FLUTICASONE PROPIONATE 50 MCG/ACT NA SUSP: 2.0000 | Freq: Every day | NASAL | 5 refills | Status: DC

## 2018-05-15 NOTE — Telephone Encounter (Signed)
[facility] requesting a refill on Fluticasone and Cetirizine, not on her current/or historical list

## 2019-03-16 ENCOUNTER — Other Ambulatory Visit: Payer: Self-pay | Admitting: Family Medicine

## 2019-03-16 DIAGNOSIS — Z1231 Encounter for screening mammogram for malignant neoplasm of breast: Secondary | ICD-10-CM

## 2019-03-18 ENCOUNTER — Ambulatory Visit
Admission: RE | Admit: 2019-03-18 | Discharge: 2019-03-18 | Disposition: A | Discharge status: Home / Self Care | Payer: 59 | Source: Ambulatory Visit | Attending: Family Medicine | Admitting: Family Medicine

## 2019-03-18 ENCOUNTER — Other Ambulatory Visit: Payer: Self-pay

## 2019-03-18 DIAGNOSIS — Z1231 Encounter for screening mammogram for malignant neoplasm of breast: Secondary | ICD-10-CM

## 2019-03-29 ENCOUNTER — Other Ambulatory Visit: Payer: Self-pay

## 2019-03-30 ENCOUNTER — Ambulatory Visit: Payer: 59 | Admitting: Family Medicine

## 2019-03-30 ENCOUNTER — Encounter: Payer: 59 | Admitting: Family Medicine

## 2019-04-12 ENCOUNTER — Other Ambulatory Visit: Payer: Self-pay

## 2019-04-13 ENCOUNTER — Ambulatory Visit (INDEPENDENT_AMBULATORY_CARE_PROVIDER_SITE_OTHER): Payer: 59 | Admitting: Family Medicine

## 2019-04-13 ENCOUNTER — Encounter: Payer: Self-pay | Admitting: Family Medicine

## 2019-04-13 VITALS — BP 108/68 | HR 94 | Temp 97.1°F | Ht 64.0 in | Wt 212.2 lb

## 2019-04-13 DIAGNOSIS — Z720 Tobacco use: Secondary | ICD-10-CM | POA: Diagnosis not present

## 2019-04-13 DIAGNOSIS — K635 Polyp of colon: Secondary | ICD-10-CM | POA: Diagnosis not present

## 2019-04-13 DIAGNOSIS — Z0000 Encounter for general adult medical examination without abnormal findings: Secondary | ICD-10-CM

## 2019-04-13 DIAGNOSIS — R0681 Apnea, not elsewhere classified: Secondary | ICD-10-CM | POA: Diagnosis not present

## 2019-04-13 MED ORDER — CETIRIZINE HCL 10 MG PO TABS: 10.0000 mg | ORAL_TABLET | Freq: Every day | ORAL | 11 refills | Status: AC

## 2019-04-13 MED ORDER — BUPROPION HCL ER (SR) 150 MG PO TB12: ORAL_TABLET | ORAL | 6 refills | Status: AC

## 2019-04-13 MED ORDER — FLUTICASONE PROPIONATE 50 MCG/ACT NA SUSP: 2.0000 | Freq: Every day | NASAL | 5 refills | Status: AC

## 2019-04-13 NOTE — Progress Notes (Signed)
Chief Complaint  Patient presents with  . Annual Exam    needs referral to GI and sleep problems     Well Woman Donna Mosley is here for a complete physical.   Her last physical was >1 year ago.  Current diet: in general, a "healthy" diet. Current exercise: walking. Weight has increased and she does snore at night. Seatbelt? Yes  Health Maintenance Pap/HPV- No Mammogram- Yes Tetanus- Yes HIV screening- Yes  Past Medical History:  Diagnosis Date  . Anemia      Past Surgical History:  Procedure Laterality Date  . TUBAL LIGATION    . TUBAL LIGATION    . WISDOM TOOTH EXTRACTION      Medications  Current Outpatient Medications on File Prior to Visit  Medication Sig Dispense Refill  . buPROPion (WELLBUTRIN SR) 150 MG 12 hr tablet Take 1 tab daily for 3 days then 1 tab twice daily. 60 tablet 3  . cetirizine (ZYRTEC ALLERGY) 10 MG tablet Take 1 tablet (10 mg total) by mouth daily. 30 tablet 11  . fluticasone (FLONASE) 50 MCG/ACT nasal spray Place 2 sprays into both nostrils daily. 16 g 5    Allergies Allergies  Allergen Reactions  . Sulfa Antibiotics Hives    Review of Systems: Constitutional:  no unexpected weight changes Eye:  no recent significant change in vision Ear/Nose/Mouth/Throat:  Ears:  no recent change in hearing Nose/Mouth/Throat:  no complaints of nasal congestion, no sore throat Cardiovascular: no chest pain Respiratory:  no shortness of breath Gastrointestinal:  no abdominal pain, no change in bowel habits GU:  Female: negative for dysuria or pelvic pain Musculoskeletal/Extremities: +R shoulder pain Integumentary (Skin/Breast):  no abnormal skin lesions reported Neurologic:  no headaches Endocrine:  denies fatigue Hematologic/Lymphatic:  No areas of easy bleeding  Exam BP 108/68 (BP Location: Left Arm, Patient Position: Sitting, Cuff Size: Normal)   Pulse 94   Temp (!) 97.1 F (36.2 C) (Temporal)   Ht 5\' 4"  (1.626 m)   Wt 212 lb 4  oz (96.3 kg)   SpO2 97%   BMI 36.43 kg/m  General:  well developed, well nourished, in no apparent distress Skin:  no significant moles, warts, or growths Head:  no masses, lesions, or tenderness Eyes:  pupils equal and round, sclera anicteric without injection Ears:  canals without lesions, TMs shiny without retraction, no obvious effusion, no erythema Nose:  nares patent, septum midline, mucosa normal, and no drainage or sinus tenderness Throat/Pharynx:  lips and gingiva without lesion; tongue and uvula midline; non-inflamed pharynx; no exudates or postnasal drainage Neck: neck supple without adenopathy, thyromegaly, or masses Lungs:  clear to auscultation, breath sounds equal bilaterally, no respiratory distress Cardio:  regular rate and rhythm, no LE edema Abdomen:  abdomen soft, nontender; bowel sounds normal; no masses or organomegaly Genital: Defer to GYN Musculoskeletal: +TTP over R trap; otherwise symmetrical muscle groups noted without atrophy or deformity Extremities:  no clubbing, cyanosis, or edema, no deformities, no skin discoloration Neuro:  gait normal; deep tendon reflexes normal and symmetric Psych: well oriented with normal range of affect and appropriate judgment/insight  Assessment and Plan  Well adult exam - Plan: CBC, Comprehensive metabolic panel, Lipid panel, FSH, LH  Tobacco abuse - Plan: buPROPion (WELLBUTRIN SR) 150 MG 12 hr tablet  Witnessed apneic spells - Plan: Ambulatory referral to Pulmonology  Polyp of colon, unspecified part of colon, unspecified type - Plan: Ambulatory referral to Gastroenterology   Well 44 y.o. female. Counseled on  diet and exercise. Needs to call GYN for cerv cancer screening. Refer pulm for OSA eval.  Refer to GI for colon polyps.  Other orders as above. Follow up in 6 mo or prn. The patient voiced understanding and agreement to the plan.  [place], DO 04/13/19 3:05 PM

## 2019-04-13 NOTE — Patient Instructions (Addendum)
Give [place] 2-3 business days to get the results of your labs back.   Keep the diet clean and stay active.  Call your GYN for an appointment.   If you do not hear anything about your referral in the next 1-2 weeks, call our office and ask for an update.  Let us know if you need anything.  Trapezius stretches/exercises Do exercises exactly as told by your health care provider and adjust them as directed. It is normal to feel mild stretching, pulling, tightness, or discomfort as you do these exercises, but you should stop right away if you feel sudden pain or your pain gets worse.  Stretching and range of motion exercises These exercises warm up your muscles and joints and improve the movement and flexibility of your shoulder. These exercises can also help to relieve pain, numbness, and tingling. If you are unable to do any of the following for any reason, do not further attempt to do it.   Exercise A: Flexion, standing    1. Stand and hold a broomstick, a cane, or a similar object. Place your hands a little more than shoulder-width apart on the object. Your left / right hand should be palm-up, and your other hand should be palm-down. 2. Push the stick to raise your left / right arm out to your side and then over your head. Use your other hand to help move the stick. Stop when you feel a stretch in your shoulder, or when you reach the angle that is recommended by your health care provider. ? Avoid shrugging your shoulder while you raise your arm. Keep your shoulder blade tucked down toward your spine. 3. Hold for 30 seconds. 4. Slowly return to the starting position. Repeat 2 times. Complete this exercise 3 times per week.  Exercise B: Abduction, supine    1. Lie on your back and hold a broomstick, a cane, or a similar object. Place your hands a little more than shoulder-width apart on the object. Your left / right hand should be palm-up, and your other hand should be palm-down. 2. Push  the stick to raise your left / right arm out to your side and then over your head. Use your other hand to help move the stick. Stop when you feel a stretch in your shoulder, or when you reach the angle that is recommended by your health care provider. ? Avoid shrugging your shoulder while you raise your arm. Keep your shoulder blade tucked down toward your spine. 3. Hold for 30 seconds. 4. Slowly return to the starting position. Repeat 2 times. Complete this exercise 3 times per week.  Exercise C: Flexion, active-assisted    1. Lie on your back. You may bend your knees for comfort. 2. Hold a broomstick, a cane, or a similar object. Place your hands about shoulder-width apart on the object. Your palms should face toward your feet. 3. Raise the stick and move your arms over your head and behind your head, toward the floor. Use your healthy arm to help your left / right arm move farther. Stop when you feel a gentle stretch in your shoulder, or when you reach the angle where your health care provider tells you to stop. 4. Hold for 30 seconds. 5. Slowly return to the starting position. Repeat 2 times. Complete this exercise 3 times per week.  Exercise D: External rotation and abduction    1. Stand in a door frame with one of your feet slightly in front of  the other. This is called a staggered stance. 2. Choose one of the following positions as told by your health care provider: ? Place your hands and forearms on the door frame above your head. ? Place your hands and forearms on the door frame at the height of your head. ? Place your hands on the door frame at the height of your elbows. 3. Slowly move your weight onto your front foot until you feel a stretch across your chest and in the front of your shoulders. Keep your head and chest upright and keep your abdominal muscles tight. 4. Hold for 30 seconds. 5. To release the stretch, shift your weight to your back foot. Repeat 2 times. Complete  this stretch 3 times per week.  Strengthening exercises These exercises build strength and endurance in your shoulder. Endurance is the ability to use your muscles for a long time, even after your muscles get tired. Exercise E: Scapular depression and adduction  1. Sit on a stable chair. Support your arms in front of you with pillows, armrests, or a tabletop. Keep your elbows in line with the sides of your body. 2. Gently move your shoulder blades down toward your middle back. Relax the muscles on the tops of your shoulders and in the back of your neck. 3. Hold for 3 seconds. 4. Slowly release the tension and relax your muscles completely before doing this exercise again. Repeat for a total of 10 repetitions. 5. After you have practiced this exercise, try doing the exercise without the arm support. Then, try the exercise while standing instead of sitting. Repeat 2 times. Complete this exercise 3 times per week.  Exercise F: Shoulder abduction, isometric    1. Stand or sit about 4-6 inches (10-15 cm) from a wall with your left / right side facing the wall. 2. Bend your left / right elbow and gently press your elbow against the wall. 3. Increase the pressure slowly until you are pressing as hard as you can without shrugging your shoulder. 4. Hold for 3 seconds. 5. Slowly release the tension and relax your muscles completely. Repeat for a total of 10 repetitions. Repeat 2 times. Complete this exercise 3 times per week.  Exercise G: Shoulder flexion, isometric    1. Stand or sit about 4-6 inches (10-15 cm) away from a wall with your left / right side facing the wall. 2. Keep your left / right elbow straight and gently press the top of your fist against the wall. Increase the pressure slowly until you are pressing as hard as you can without shrugging your shoulder. 3. Hold for 10-15 seconds. 4. Slowly release the tension and relax your muscles completely. Repeat for a total of 10  repetitions. Repeat 2 times. Complete this exercise 3 times per week.  Exercise H: Internal rotation    1. Sit in a stable chair without armrests, or stand. Secure an exercise band at your left / right side, at elbow height. 2. Place a soft object, such as a folded towel or a small pillow, under your left / right upper arm so your elbow is a few inches (about 8 cm) away from your side. 3. Hold the end of the exercise band so the band stretches. 4. Keeping your elbow pressed against the soft object under your arm, move your forearm across your body toward your abdomen. Keep your body steady so the movement is only coming from your shoulder. 5. Hold for 3 seconds. 6. Slowly return to  the starting position. Repeat for a total of 10 repetitions. Repeat 2 times. Complete this exercise 3 times per week.  Exercise I: External rotation    1. Sit in a stable chair without armrests, or stand. 2. Secure an exercise band at your left / right side, at elbow height. 3. Place a soft object, such as a folded towel or a small pillow, under your left / right upper arm so your elbow is a few inches (about 8 cm) away from your side. 4. Hold the end of the exercise band so the band stretches. 5. Keeping your elbow pressed against the soft object under your arm, move your forearm out, away from your abdomen. Keep your body steady so the movement is only coming from your shoulder. 6. Hold for 3 seconds. 7. Slowly return to the starting position. Repeat for a total of 10 repetitions. Repeat 2 times. Complete this exercise 3 times per week. Exercise J: Shoulder extension  1. Sit in a stable chair without armrests, or stand. Secure an exercise band to a stable object in front of you so the band is at shoulder height. 2. Hold one end of the exercise band in each hand. Your palms should face each other. 3. Straighten your elbows and lift your hands up to shoulder height. 4. Step back, away from the secured end of  the exercise band, until the band stretches. 5. Squeeze your shoulder blades together and pull your hands down to the sides of your thighs. Stop when your hands are straight down by your sides. Do not let your hands go behind your body. 6. Hold for 3 seconds. 7. Slowly return to the starting position. Repeat for a total of 10 repetitions. Repeat 2 times. Complete this exercise 3 times per week.  Exercise K: Shoulder extension, prone    1. Lie on your abdomen on a firm surface so your left / right arm hangs over the edge. 2. Hold a 5 lb weight in your hand so your palm faces in toward your body. Your arm should be straight. 3. Squeeze your shoulder blade down toward the middle of your back. 4. Slowly raise your arm behind you, up to the height of the surface that you are lying on. Keep your arm straight. 5. Hold for 3 seconds. 6. Slowly return to the starting position and relax your muscles. Repeat for a total of 10 repetitions. Repeat 2 times. Complete this exercise 3 times per week.   Exercise L: Horizontal abduction, prone  1. Lie on your abdomen on a firm surface so your left / right arm hangs over the edge. 2. Hold a 5 lb weight in your hand so your palm faces toward your feet. Your arm should be straight. 3. Squeeze your shoulder blade down toward the middle of your back. 4. Bend your elbow so your hand moves up, until your elbow is bent to an "L" shape (90 degrees). With your elbow bent, slowly move your forearm forward and up. Raise your hand up to the height of the surface that you are lying on. ? Your upper arm should not move, and your elbow should stay bent. ? At the top of the movement, your palm should face the floor. 5. Hold for 3 seconds. 6. Slowly return to the starting position and relax your muscles. Repeat for a total of 10 repetitions. Repeat 2 times. Complete this exercise 3 times per week.  Exercise M: Horizontal abduction, standing  1. Sit on a  stable chair, or  stand. 2. Secure an exercise band to a stable object in front of you so the band is at shoulder height. 3. Hold one end of the exercise band in each hand. 4. Straighten your elbows and lift your hands straight in front of you, up to shoulder height. Your palms should face down, toward the floor. 5. Step back, away from the secured end of the exercise band, until the band stretches. 6. Move your arms out to your sides, and keep your arms straight. 7. Hold for 3 seconds. 8. Slowly return to the starting position. Repeat for a total of 10 repetitions. Repeat 2 times. Complete this exercise 3 times per week.  Exercise N: Scapular retraction and elevation  1. Sit on a stable chair, or stand. 2. Secure an exercise band to a stable object in front of you so the band is at shoulder height. 3. Hold one end of the exercise band in each hand. Your palms should face each other. 4. Sit in a stable chair without armrests, or stand. 5. Step back, away from the secured end of the exercise band, until the band stretches. 6. Squeeze your shoulder blades together and lift your hands over your head. Keep your elbows straight. 7. Hold for 3 seconds. 8. Slowly return to the starting position. Repeat for a total of 10 repetitions. Repeat 2 times. Complete this exercise 3 times per week.  This information is not intended to replace advice given to you by your health care provider. Make sure you discuss any questions you have with your health care provider. Document Released: 12/31/2004 Document Revised: 09/07/2015 Document Reviewed: 11/17/2014 Elsevier Interactive Patient Education  2017 [employer].

## 2019-04-14 LAB — CBC
HCT: 39.2 % (ref 36.0–46.0)
Hemoglobin: 12.4 g/dL (ref 12.0–15.0)
MCHC: 31.7 g/dL (ref 30.0–36.0)
MCV: 75.2 fl — ABNORMAL LOW (ref 78.0–100.0)
Platelets: 240 10*3/uL (ref 150.0–400.0)
RBC: 5.22 Mil/uL — ABNORMAL HIGH (ref 3.87–5.11)
RDW: 15.4 % (ref 11.5–15.5)
WBC: 7.1 10*3/uL (ref 4.0–10.5)

## 2019-04-14 LAB — LIPID PANEL
Cholesterol: 180 mg/dL (ref 0–200)
HDL: 42.7 mg/dL (ref >39.00)
LDL Cholesterol: 112 mg/dL — ABNORMAL HIGH (ref 0–99)
NonHDL: 137.26
Total CHOL/HDL Ratio: 4
Triglycerides: 127 mg/dL (ref 0.0–149.0)
VLDL: 25.4 mg/dL (ref 0.0–40.0)

## 2019-04-14 LAB — COMPREHENSIVE METABOLIC PANEL
ALT: 15 U/L (ref 0–35)
AST: 18 U/L (ref 0–37)
Albumin: 4 g/dL (ref 3.5–5.2)
Alkaline Phosphatase: 76 U/L (ref 39–117)
BUN: 17 mg/dL (ref 6–23)
CO2: 30 mEq/L (ref 19–32)
Calcium: 9.2 mg/dL (ref 8.4–10.5)
Chloride: 105 mEq/L (ref 96–112)
Creatinine, Ser: 0.69 mg/dL (ref 0.40–1.20)
GFR: 111.75 mL/min (ref >60.00)
Glucose, Bld: 79 mg/dL (ref 70–99)
Potassium: 4.3 mEq/L (ref 3.5–5.1)
Sodium: 140 mEq/L (ref 135–145)
Total Bilirubin: 0.4 mg/dL (ref 0.2–1.2)
Total Protein: 7.3 g/dL (ref 6.0–8.3)

## 2019-04-14 LAB — FOLLICLE STIMULATING HORMONE: FSH: 74 m[IU]/mL

## 2019-04-14 LAB — LUTEINIZING HORMONE: LH: 27.68 m[IU]/mL

## 2019-05-03 ENCOUNTER — Telehealth: Payer: Self-pay | Admitting: Family Medicine

## 2019-05-03 NOTE — Telephone Encounter (Addendum)
LM for [facility] that all demos/ins/OV have been refaxed to [phone] for pulm and gastro referrals - did not complete their fax sheet as we use electronic referral process and do not have a way to upload their form.

## 2020-08-04 ENCOUNTER — Encounter: Payer: 59 | Admitting: Family Medicine

## 2023-01-19 ENCOUNTER — Emergency Department (HOSPITAL_BASED_OUTPATIENT_CLINIC_OR_DEPARTMENT_OTHER): Payer: BC Managed Care – PPO

## 2023-01-19 ENCOUNTER — Encounter (HOSPITAL_BASED_OUTPATIENT_CLINIC_OR_DEPARTMENT_OTHER): Payer: Self-pay | Admitting: Emergency Medicine

## 2023-01-19 ENCOUNTER — Other Ambulatory Visit: Payer: Self-pay

## 2023-01-19 ENCOUNTER — Emergency Department (HOSPITAL_BASED_OUTPATIENT_CLINIC_OR_DEPARTMENT_OTHER)
Admission: EM | Admit: 2023-01-19 | Discharge: 2023-01-19 | Disposition: A | Discharge status: Home / Self Care | Payer: BC Managed Care – PPO | Attending: Emergency Medicine | Admitting: Emergency Medicine

## 2023-01-19 DIAGNOSIS — R0789 Other chest pain: Secondary | ICD-10-CM | POA: Insufficient documentation

## 2023-01-19 DIAGNOSIS — R079 Chest pain, unspecified: Secondary | ICD-10-CM | POA: Diagnosis present

## 2023-01-19 DIAGNOSIS — K769 Liver disease, unspecified: Secondary | ICD-10-CM

## 2023-01-19 DIAGNOSIS — R1012 Left upper quadrant pain: Secondary | ICD-10-CM | POA: Diagnosis not present

## 2023-01-19 LAB — URINALYSIS, ROUTINE W REFLEX MICROSCOPIC
Bilirubin Urine: NEGATIVE
Glucose, UA: NEGATIVE mg/dL
Hgb urine dipstick: NEGATIVE
Ketones, ur: NEGATIVE mg/dL
Leukocytes,Ua: NEGATIVE
Nitrite: NEGATIVE
Protein, ur: NEGATIVE mg/dL
Specific Gravity, Urine: 1.025 (ref 1.005–1.030)
pH: 5.5 (ref 5.0–8.0)

## 2023-01-19 LAB — COMPREHENSIVE METABOLIC PANEL
ALT: 16 U/L (ref 0–44)
AST: 20 U/L (ref 15–41)
Albumin: 3.7 g/dL (ref 3.5–5.0)
Alkaline Phosphatase: 74 U/L (ref 38–126)
Anion gap: 7 (ref 5–15)
BUN: 13 mg/dL (ref 6–20)
CO2: 25 mmol/L (ref 22–32)
Calcium: 9 mg/dL (ref 8.9–10.3)
Chloride: 107 mmol/L (ref 98–111)
Creatinine, Ser: 0.61 mg/dL (ref 0.44–1.00)
GFR, Estimated: >60 mL/min (ref >60)
Glucose, Bld: 81 mg/dL (ref 70–99)
Potassium: 3.9 mmol/L (ref 3.5–5.1)
Sodium: 139 mmol/L (ref 135–145)
Total Bilirubin: 0.5 mg/dL (ref 0.0–1.2)
Total Protein: 7.6 g/dL (ref 6.5–8.1)

## 2023-01-19 LAB — CBC
HCT: 40.4 % (ref 36.0–46.0)
Hemoglobin: 12.7 g/dL (ref 12.0–15.0)
MCH: 23.1 pg — ABNORMAL LOW (ref 26.0–34.0)
MCHC: 31.4 g/dL (ref 30.0–36.0)
MCV: 73.5 fL — ABNORMAL LOW (ref 80.0–100.0)
Platelets: 264 10*3/uL (ref 150–400)
RBC: 5.5 MIL/uL — ABNORMAL HIGH (ref 3.87–5.11)
RDW: 15.6 % — ABNORMAL HIGH (ref 11.5–15.5)
WBC: 5.3 10*3/uL (ref 4.0–10.5)
nRBC: 0 % (ref 0.0–0.2)

## 2023-01-19 LAB — LIPASE, BLOOD: Lipase: 38 U/L (ref 11–51)

## 2023-01-19 LAB — HCG, SERUM, QUALITATIVE: Preg, Serum: NEGATIVE

## 2023-01-19 LAB — D-DIMER, QUANTITATIVE: D-Dimer, Quant: <0.27 ug{FEU}/mL (ref 0.00–0.50)

## 2023-01-19 LAB — TROPONIN I (HIGH SENSITIVITY)
Troponin I (High Sensitivity): 2 ng/L (ref <18)
Troponin I (High Sensitivity): 2 ng/L (ref <18)

## 2023-01-19 MED ORDER — ALUM & MAG HYDROXIDE-SIMETH 200-200-20 MG/5ML PO SUSP
15.0000 mL | Freq: Once | ORAL | Status: AC
  Administered 2023-01-19: 15 mL via ORAL
  Filled 2023-01-19: qty 30

## 2023-01-19 MED ORDER — MORPHINE SULFATE (PF) 4 MG/ML IV SOLN
4.0000 mg | Freq: Once | INTRAVENOUS | Status: AC
  Administered 2023-01-19: 4 mg via INTRAVENOUS
  Filled 2023-01-19: qty 1

## 2023-01-19 MED ORDER — IOHEXOL 300 MG/ML  SOLN
100.0000 mL | Freq: Once | INTRAMUSCULAR | Status: AC | PRN
  Administered 2023-01-19: 100 mL via INTRAVENOUS

## 2023-01-19 MED ORDER — OMEPRAZOLE 20 MG PO CPDR
20.0000 mg | DELAYED_RELEASE_CAPSULE | Freq: Every day | ORAL | 0 refills | Status: AC
Start: 2023-01-19 — End: ?

## 2023-01-19 NOTE — ED Provider Notes (Signed)
 [facility] Provider Note   CSN: [id number] Arrival date & time: 01/19/23  1045     History  Chief Complaint  Patient presents with   Chest Pain   Back Pain    Donna Mosley is a 48 y.o. female with history of anemia who presents the emergency department complaining of chest pain for the past 2 weeks.  Patient states that she is intermittently been having left-sided chest pain radiating to her back.  It is intermittent, and often only lasts for a few seconds before resolving on its own.  She was at church and when she stood up her chest was hurting worse and she was having a hard time catching her breath, and she has felt somewhat more short of breath recently.  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  She did have a snack while in the waiting room, and felt like that might of made her symptoms worse.  No nausea, but has had some constipation, when she's able to have a bowel movement it's usually loose. No fever, cough. She is on wegovy.    Chest Pain Associated symptoms: back pain   Back Pain Associated symptoms: chest pain        Home Medications Prior to Admission medications   Medication Sig Start Date End Date Taking? Authorizing Provider  omeprazole  (PRILOSEC) 20 MG capsule Take 1 capsule (20 mg total) by mouth daily. 01/19/23  Yes Bobbi Kozakiewicz T, PA-C  buPROPion  (WELLBUTRIN  SR) 150 MG 12 hr tablet take 1 tab twice daily. 04/13/19   Frann Mabel Mt, DO  cetirizine  (ZYRTEC  ALLERGY) 10 MG tablet Take 1 tablet (10 mg total) by mouth daily. 04/13/19   Frann Mabel Mt, DO  fluticasone  (FLONASE ) 50 MCG/ACT nasal spray Place 2 sprays into both nostrils daily. 04/13/19   Frann Mabel Mt, DO      Allergies    Sulfa antibiotics    Review of Systems   Review of Systems  Cardiovascular:  Positive for chest pain.  Musculoskeletal:   Positive for back pain.  All other systems reviewed and are negative.   Physical Exam Updated Vital Signs BP 139/88 (BP Location: Left Arm)   Pulse 68   Temp 97.9 F (36.6 C) (Oral)   Resp 19   Ht 5' 4 (1.626 m)   Wt 84.4 kg   SpO2 97%   BMI 31.93 kg/m  Physical Exam Vitals and nursing note reviewed.  Constitutional:      Appearance: Normal appearance.  HENT:     Head: Normocephalic and atraumatic.  Eyes:     Conjunctiva/sclera: Conjunctivae normal.  Cardiovascular:     Rate and Rhythm: Normal rate and regular rhythm.  Pulmonary:     Effort: Pulmonary effort is normal. No respiratory distress.     Breath sounds: Normal breath sounds.  Chest:     Comments: Left anterior chest wall tenderness to palpation Abdominal:     General: There is no distension.     Palpations: Abdomen is soft.     Tenderness: There is abdominal tenderness in the left upper quadrant.  Musculoskeletal:     Right lower leg: No edema.     Left lower leg: No edema.  Skin:    General: Skin is warm and dry.  Neurological:     General: No focal deficit present.     Mental Status: She is  alert.     ED Results / Procedures / Treatments   Labs (all labs ordered are listed, but only abnormal results are displayed) Labs Reviewed  CBC - Abnormal; Notable for the following components:      Result Value   RBC 5.50 (*)    MCV 73.5 (*)    MCH 23.1 (*)    RDW 15.6 (*)    All other components within normal limits  LIPASE, BLOOD  COMPREHENSIVE METABOLIC PANEL  URINALYSIS, ROUTINE W REFLEX MICROSCOPIC  HCG, SERUM, QUALITATIVE  D-DIMER, QUANTITATIVE  TROPONIN I (HIGH SENSITIVITY)  TROPONIN I (HIGH SENSITIVITY)    EKG EKG Interpretation Date/Time:  Sunday January 19 2023 11:06:23 EST Ventricular Rate:  78 PR Interval:  162 QRS Duration:  91 QT Interval:  366 QTC Calculation: 417 R Axis:   53  Text Interpretation: Sinus rhythm Low voltage, precordial leads Confirmed by Lenor Hollering [phone])  on 01/19/2023 3:14:11 PM  Radiology CT ABDOMEN PELVIS W CONTRAST Result Date: 01/19/2023 CLINICAL DATA:  Left upper quadrant abdominal pain EXAM: CT ABDOMEN AND PELVIS WITH CONTRAST TECHNIQUE: Multidetector CT imaging of the abdomen and pelvis was performed using the standard protocol following bolus administration of intravenous contrast. RADIATION DOSE REDUCTION: This exam was performed according to the departmental dose-optimization program which includes automated exposure control, adjustment of the mA and/or kV according to patient size and/or use of iterative reconstruction technique. CONTRAST:  OMNIPAQUE  IOHEXOL  300 MG/ML  SOLN COMPARISON:  None Available. FINDINGS: Lower chest: No acute abnormality. Hepatobiliary: There is a mass within segments 2 and 3 which is mildly hyperdense compared to the adjacent liver containing central area of low attenuation. This measures 5.4 x 3.2 cm, image 16/301. Smaller hyperdense lesion in the right lobe of liver measures 9 mm, image 26/301. Gallbladder appears normal. No bile duct dilatation. Pancreas: Unremarkable. No pancreatic ductal dilatation or surrounding inflammatory changes. Spleen: Normal in size without focal abnormality. Adrenals/Urinary Tract: Normal adrenal glands. No nephrolithiasis, hydronephrosis or suspicious mass. Urinary bladder appears normal. Stomach/Bowel: Stomach is normal. The appendix is visualized and appears normal. Mild stool burden is identified within the colon. No bowel wall thickening, inflammation or distension. Vascular/Lymphatic: Normal appearance of the abdominal aorta no signs of abdominopelvic adenopathy. Reproductive: Uterus and bilateral adnexa are unremarkable. Other: No ascites or focal fluid collections identified. Small fat containing umbilical hernia. Musculoskeletal: No acute or significant osseous findings. IMPRESSION: 1. No acute findings within the abdomen or pelvis. 2. There is a mass within segments 2 and 3 of the  liver which is mildly hyperdense compared to the adjacent liver containing central area of low attenuation. This measures 5.4 x 3.2 cm. Smaller hyperdense lesion in the right lobe of liver measures 9 mm. These lesions are indeterminate, but may reflect benign areas of focal nodular hyperplasia or adenomas. Recommend further evaluation with nonemergent, outpatient MRI of the abdomen with and without Eovist contrast. 3. Mild stool burden within the colon. Correlate for any clinical signs or symptoms of constipation. 4. Small fat containing umbilical hernia. Electronically Signed   By: Waddell Calk M.D.   On: 01/19/2023 17:01   DG Chest Portable 1 View Result Date: 01/19/2023 CLINICAL DATA:  Left chest pain EXAM: PORTABLE CHEST 1 VIEW COMPARISON:  Chest x-ray 02/03/2016 FINDINGS: The heart size and mediastinal contours are within normal limits. Both lungs are clear. The visualized skeletal structures are unremarkable. IMPRESSION: No active disease. Electronically Signed   By: Greig Pique M.D.   On: 01/19/2023 16:53  US  Abdomen Complete Result Date: 01/19/2023 CLINICAL DATA:  Chest pain and left upper quadrant pain. EXAM: ABDOMEN ULTRASOUND COMPLETE COMPARISON:  None Available. FINDINGS: Gallbladder: Contracted gallbladder without focal tenderness or regional edema. Cannot exclude 5 mm mobile calculus versus artifact. Common bile duct: Diameter: 5 mm Liver: No focal lesion identified. Within normal limits in parenchymal echogenicity. Portal vein is patent on color Doppler imaging with normal direction of blood flow towards the liver. IVC: No abnormality visualized. Pancreas: Visualized portion unremarkable. Spleen: Size and appearance within normal limits. Right Kidney: Length: 12.5 cm. Echogenicity within normal limits. No mass or hydronephrosis visualized. Left Kidney: Length: 12.9 cm. Echogenicity within normal limits. No mass or hydronephrosis visualized. Abdominal aorta: No aneurysm visualized. IMPRESSION:  1. No acute finding. 2. Contracted gallbladder with equivocal tiny calculus. Electronically Signed   By: Dorn Roulette M.D.   On: 01/19/2023 12:10    Procedures Procedures    Medications Ordered in ED Medications  morphine  (PF) 4 MG/ML injection 4 mg (4 mg Intravenous Given 01/19/23 1439)  alum & mag hydroxide-simeth (MAALOX/MYLANTA) 200-200-20 MG/5ML suspension 15 mL (15 mLs Oral Given 01/19/23 1531)  iohexol  (OMNIPAQUE ) 300 MG/ML solution 100 mL (100 mLs Intravenous Contrast Given 01/19/23 1638)    ED Course/ Medical Decision Making/ A&P                                 Medical Decision Making Amount and/or Complexity of Data Reviewed Labs: ordered. Radiology: ordered.  Risk Prescription drug management.   This patient is a 48 y.o. female  who presents to the ED for concern of chest and back pain.   Differential diagnoses prior to evaluation: The emergent differential diagnosis includes, but is not limited to,  ACS, pericarditis, myocarditis, aortic dissection, PE, pneumothorax, esophageal spasm or rupture, chronic angina, pneumonia, bronchitis, GERD, reflux/PUD, biliary disease, pancreatitis, costochondritis, anxiety. This is not an exhaustive differential.   Past Medical History / Co-morbidities / Social History: Anemia, OSA  Additional history: Chart reviewed. Pertinent results include: some pain in the left breast on exam, screening mammogram was unremarkable  Physical Exam: Physical exam performed. The pertinent findings include: Mildly hypertensive, otherwise normal vital signs.  No acute distress.  Left anterior chest wall/breast tenderness to palpation, and left upper quadrant tenderness, without guarding or rebound.  Abdomen soft.  Heart regular rate and rhythm, lung sounds clear.  No peripheral edema.  Lab Tests/Imaging studies: I personally interpreted labs/imaging and the pertinent results include: CBC and CMP grossly unremarkable.  Negative lipase, negative  troponin.  Negative pregnancy, UA unremarkable.  Abdominal ultrasound with no acute finding, did show contracted gallbladder with equivocal tiny calculus.  Chest x-ray without acute abnormalities.  CT abdomen pelvis with no acute findings, mass on the liver the radiologist recommended nonemergent MRI.  I agree with the radiologist interpretation.  Cardiac monitoring: EKG obtained and interpreted by myself and attending physician which shows: NSR   Medications: I ordered medication including morphine , GI cocktail.  I have reviewed the patients home medicines and have made adjustments as needed.   Disposition: After consideration of the diagnostic results and the patients response to treatment, I feel that emergency department workup does not suggest an emergent condition requiring admission or immediate intervention beyond what has been performed at this time. Patient is to be discharged with recommendation to follow up with PCP in regards to today's hospital visit. Chest pain is not likely of cardiac  or pulmonary etiology d/t presentation, d-dimer for PE negative, VSS, no tracheal deviation, no JVD or new murmur, RRR, breath sounds equal bilaterally, EKG without acute abnormalities, negative troponin, and negative CXR. Heart score of 2. Pt has been advised to return to the ED if CP becomes exertional, associated with diaphoresis or nausea, radiates to left jaw/arm, worsens or becomes concerning in any way. The plan is: Discharge to home.  Will start on omeprazole  for possible GERD/PUD, recommend following up with PCP for further liver imaging. Pt appears reliable for follow up and is agreeable to discharge.   Final Clinical Impression(s) / ED Diagnoses Final diagnoses:  Left upper quadrant pain  Left-sided chest pain  Liver lesion    Rx / DC Orders ED Discharge Orders          Ordered    omeprazole  (PRILOSEC) 20 MG capsule  Daily        01/19/23 1716           Portions of this report  may have been transcribed using voice recognition software. Every effort was made to ensure accuracy; however, inadvertent computerized transcription errors may be present.    Corie Friddie DASEN, PA-C 01/19/23 1722    Lenor Hollering, MD 01/20/23 1101

## 2023-01-19 NOTE — ED Triage Notes (Signed)
 Chest pain radiating to back for 2 weeks.  No known fever.  No N/V.  Some diarrhea.  Pt on wegovy.

## 2023-01-19 NOTE — Discharge Instructions (Addendum)
 You were seen in the emergency department today for chest pain.  As we discussed your lab work, EKG, chest x-ray all looked reassuring today.  I think that your symptoms could likely been related to your stomach, esophagus, or gall bladder.   I recommend monitoring your stress levels.  Continue to monitor how you are doing overall, and return to the emergency department for any new or worsening symptoms such as: Worsening pain or pain with exertion, difficulty breathing, sweating, or pain or swelling in your legs.  Also, we discussed an incidental finding of a lesion on your liver.  The radiologist recommended that you have a follow-up MRI of this.  Please discuss this with your primary doctor so they can get this scheduled outpatient.  It is unlikely to be causing your symptoms today.   I have attached the contact information for the general surgery clinic, if it is determined that your pain is coming from your gallbladder, you could make an appointment and discuss gallbladder removal surgery.   Continue to monitor how you're doing and return to the ER for new or worsening symptoms.
